# Patient Record
Sex: Male | Born: 1970
Health system: Southern US, Community
[De-identification: ages and names within clinical notes are randomized; demographics above are authoritative.]

## PROBLEM LIST (undated history)

## (undated) DIAGNOSIS — E119 Type 2 diabetes mellitus without complications: Secondary | ICD-10-CM

## (undated) HISTORY — DX: Type 2 diabetes mellitus without complications: E11.9

---

## 2013-09-17 ENCOUNTER — Other Ambulatory Visit (HOSPITAL_COMMUNITY): Payer: Self-pay | Admitting: Pulmonary Disease

## 2013-09-17 DIAGNOSIS — R945 Abnormal results of liver function studies: Principal | ICD-10-CM

## 2013-09-17 DIAGNOSIS — R7989 Other specified abnormal findings of blood chemistry: Secondary | ICD-10-CM

## 2013-09-22 ENCOUNTER — Ambulatory Visit (HOSPITAL_COMMUNITY)
Admission: RE | Admit: 2013-09-22 | Discharge: 2013-09-22 | Disposition: A | Discharge status: Home / Self Care | Payer: Federal, State, Local not specified - PPO | Source: Ambulatory Visit | Attending: Pulmonary Disease | Admitting: Pulmonary Disease

## 2013-09-22 DIAGNOSIS — R7989 Other specified abnormal findings of blood chemistry: Secondary | ICD-10-CM | POA: Insufficient documentation

## 2013-09-22 DIAGNOSIS — R945 Abnormal results of liver function studies: Secondary | ICD-10-CM

## 2013-10-16 ENCOUNTER — Encounter (INDEPENDENT_AMBULATORY_CARE_PROVIDER_SITE_OTHER): Payer: Self-pay | Admitting: *Deleted

## 2013-11-18 ENCOUNTER — Ambulatory Visit (INDEPENDENT_AMBULATORY_CARE_PROVIDER_SITE_OTHER): Payer: Federal, State, Local not specified - PPO | Admitting: Internal Medicine

## 2013-12-03 ENCOUNTER — Encounter (INDEPENDENT_AMBULATORY_CARE_PROVIDER_SITE_OTHER): Payer: Self-pay | Admitting: Internal Medicine

## 2013-12-03 ENCOUNTER — Ambulatory Visit (INDEPENDENT_AMBULATORY_CARE_PROVIDER_SITE_OTHER): Payer: Federal, State, Local not specified - PPO | Admitting: Internal Medicine

## 2013-12-03 VITALS — BP 88/50 | HR 68 | Temp 98.1°F | Ht 71.0 in | Wt 184.5 lb

## 2013-12-03 DIAGNOSIS — E119 Type 2 diabetes mellitus without complications: Secondary | ICD-10-CM | POA: Insufficient documentation

## 2013-12-03 DIAGNOSIS — R748 Abnormal levels of other serum enzymes: Secondary | ICD-10-CM | POA: Insufficient documentation

## 2013-12-03 NOTE — Patient Instructions (Signed)
OV in 3 months. 

## 2013-12-03 NOTE — Progress Notes (Signed)
   Subjective:    Patient ID: Anthony ClinchBradley Bossard, male    DOB: 07/20/1970, 43 y.o.   MRN: [id number]  HPI Referred to our office by Dr. Juanetta GoslingHawkins for elevated liver enzymes.  Prior hx of elevated liver enzymes in the past. Past hx of taking Zocor and was taking off. His appetite is good. No weight loss. No abdominal pain. Usually has a BM x 1 day.  Hx significant for Diabetes Type 1 x 10 yrs.  09/10/2013 ALP 98, AST 44, ALT 58, total bili 0.5. 09/18/2013 Hep B Surface Antigen negative, Hep C antibody negative, Hep B core Ab, IgM negative., Hepatitis A antibody, IgM negative.  Hemoglobin A1C 6.7 09/22/2013 US liver : No focal lesions identified. WNL in parenchymal echogenicity.  Review of Systems Past Medical History  Diagnosis Date  . Diabetes     Type 1 x 10 yrs    History reviewed. No pertinent past surgical history.  Allergies  Allergen Reactions  . Codeine     Nausea and vomiting    No current outpatient prescriptions on file prior to visit.   No current facility-administered medications on file prior to visit.        Objective:   Physical Exam Filed Vitals:   12/03/13 1504  BP: 88/50  Pulse: 68  Temp: 98.1 F (36.7 C)  Height: 5\' 11"  (1.803 m)  Weight: 184 lb 8 oz (83.689 kg)  Alert and oriented. Skin warm and dry. Oral mucosa is moist.   . Sclera anicteric, conjunctivae is pink. Thyroid not enlarged. No cervical lymphadenopathy. Lungs clear. Heart regular rate and rhythm.  Abdomen is soft. Bowel sounds are positive. No hepatomegaly. No abdominal masses felt. No tenderness.  No edema to lower extremities. Patient is alert and oriented.       Assessment & Plan:  Elevated liver enzymes.  Hep  A, B and C ruled out. US abdomen was normal Enzymes slightly elevated. Repeat today and OV in 3 months and will repeat

## 2013-12-04 ENCOUNTER — Telehealth (INDEPENDENT_AMBULATORY_CARE_PROVIDER_SITE_OTHER): Payer: Self-pay | Admitting: Internal Medicine

## 2013-12-04 DIAGNOSIS — R748 Abnormal levels of other serum enzymes: Secondary | ICD-10-CM

## 2013-12-04 LAB — HEPATIC FUNCTION PANEL
ALBUMIN: 4.7 g/dL (ref 3.5–5.2)
ALT: 78 U/L — AB (ref 0–53)
AST: 42 U/L — ABNORMAL HIGH (ref 0–37)
Alkaline Phosphatase: 121 U/L — ABNORMAL HIGH (ref 39–117)
BILIRUBIN INDIRECT: 0.4 mg/dL (ref 0.2–1.2)
Bilirubin, Direct: 0.1 mg/dL (ref 0.0–0.3)
TOTAL PROTEIN: 7 g/dL (ref 6.0–8.3)
Total Bilirubin: 0.5 mg/dL (ref 0.2–1.2)

## 2013-12-04 NOTE — Telephone Encounter (Signed)
Error

## 2013-12-04 NOTE — Telephone Encounter (Signed)
Message left

## 2013-12-06 LAB — CBC WITH DIFFERENTIAL/PLATELET
Basophils Absolute: 0 10*3/uL (ref 0.0–0.1)
Basophils Relative: 0 % (ref 0–1)
EOS ABS: 0.1 10*3/uL (ref 0.0–0.7)
Eosinophils Relative: 1 % (ref 0–5)
HEMATOCRIT: 43.7 % (ref 39.0–52.0)
HEMOGLOBIN: 15.7 g/dL (ref 13.0–17.0)
LYMPHS ABS: 1.3 10*3/uL (ref 0.7–4.0)
Lymphocytes Relative: 19 % (ref 12–46)
MCH: 30.1 pg (ref 26.0–34.0)
MCHC: 35.9 g/dL (ref 30.0–36.0)
MCV: 83.9 fL (ref 78.0–100.0)
MONOS PCT: 9 % (ref 3–12)
Monocytes Absolute: 0.6 10*3/uL (ref 0.1–1.0)
NEUTROS PCT: 71 % (ref 43–77)
Neutro Abs: 4.8 10*3/uL (ref 1.7–7.7)
Platelets: 303 10*3/uL (ref 150–400)
RBC: 5.21 MIL/uL (ref 4.22–5.81)
RDW: 12.7 % (ref 11.5–15.5)
WBC: 6.8 10*3/uL (ref 4.0–10.5)

## 2013-12-06 LAB — FERRITIN: Ferritin: 47 ng/mL (ref 22–322)

## 2013-12-08 LAB — MITOCHONDRIAL/SMOOTH MUSCLE AB PNL
MITOCHONDRIAL M2 AB, IGG: 0.69 (ref <0.91)
SMOOTH MUSCLE AB: 33 U — AB (ref <20)

## 2013-12-08 LAB — ANA: Anti Nuclear Antibody(ANA): NEGATIVE

## 2013-12-16 ENCOUNTER — Telehealth (INDEPENDENT_AMBULATORY_CARE_PROVIDER_SITE_OTHER): Payer: Self-pay | Admitting: Internal Medicine

## 2013-12-16 NOTE — Telephone Encounter (Signed)
Off Zocor for a couple of years. Will repeat Hepatic function in 4 weeks. If it is climbing, I will talk with Dr. Karilyn Cotaehman about a liver biopsy. Needs to exercise.

## 2013-12-17 ENCOUNTER — Telehealth (INDEPENDENT_AMBULATORY_CARE_PROVIDER_SITE_OTHER): Payer: Self-pay | Admitting: *Deleted

## 2013-12-17 DIAGNOSIS — R748 Abnormal levels of other serum enzymes: Secondary | ICD-10-CM

## 2013-12-17 NOTE — Telephone Encounter (Signed)
.  Per Delrae Renderri Setzer,NP patient will need to have lab work drawn in 4 weeks.

## 2013-12-24 ENCOUNTER — Other Ambulatory Visit (INDEPENDENT_AMBULATORY_CARE_PROVIDER_SITE_OTHER): Payer: Self-pay | Admitting: *Deleted

## 2013-12-24 ENCOUNTER — Encounter (INDEPENDENT_AMBULATORY_CARE_PROVIDER_SITE_OTHER): Payer: Self-pay | Admitting: *Deleted

## 2013-12-24 DIAGNOSIS — R748 Abnormal levels of other serum enzymes: Secondary | ICD-10-CM

## 2014-01-18 LAB — HEPATIC FUNCTION PANEL
ALK PHOS: 120 U/L — AB (ref 39–117)
ALT: 69 U/L — ABNORMAL HIGH (ref 0–53)
AST: 39 U/L — ABNORMAL HIGH (ref 0–37)
Albumin: 4.4 g/dL (ref 3.5–5.2)
BILIRUBIN DIRECT: 0.1 mg/dL (ref 0.0–0.3)
BILIRUBIN TOTAL: 0.6 mg/dL (ref 0.2–1.2)
Indirect Bilirubin: 0.5 mg/dL (ref 0.2–1.2)
Total Protein: 6.9 g/dL (ref 6.0–8.3)

## 2014-01-27 ENCOUNTER — Telehealth (INDEPENDENT_AMBULATORY_CARE_PROVIDER_SITE_OTHER): Payer: Self-pay | Admitting: *Deleted

## 2014-01-27 DIAGNOSIS — R748 Abnormal levels of other serum enzymes: Secondary | ICD-10-CM

## 2014-01-27 NOTE — Addendum Note (Signed)
Addended by: Shona NeedlesDD, Donovan Gatchel M on: 01/27/2014 03:27 PM   Modules accepted: Orders

## 2014-01-27 NOTE — Telephone Encounter (Signed)
.  Per Anthony Renderri Setzer,NP have labs done in 3 months.

## 2014-02-19 ENCOUNTER — Encounter (INDEPENDENT_AMBULATORY_CARE_PROVIDER_SITE_OTHER): Payer: Self-pay

## 2014-03-17 ENCOUNTER — Encounter (INDEPENDENT_AMBULATORY_CARE_PROVIDER_SITE_OTHER): Payer: Self-pay | Admitting: Internal Medicine

## 2014-03-17 ENCOUNTER — Ambulatory Visit (INDEPENDENT_AMBULATORY_CARE_PROVIDER_SITE_OTHER): Payer: Federal, State, Local not specified - PPO | Admitting: Internal Medicine

## 2014-03-17 VITALS — BP 100/64 | HR 66 | Temp 98.4°F | Resp 18 | Ht 71.0 in | Wt 155.1 lb

## 2014-03-17 DIAGNOSIS — R748 Abnormal levels of other serum enzymes: Secondary | ICD-10-CM

## 2014-03-17 NOTE — Patient Instructions (Signed)
Physician will call with results of blood tests when completed. Please check with Dr. Juanetta GoslingHawkins about getting hepatitis A and B vaccination

## 2014-03-17 NOTE — Progress Notes (Signed)
Presenting complaint;  Follow-up for elevated transaminases.  Database;  Patient is 44 year old Caucasian male who has history of elevated transaminases and was last seen in September 2015 by Ms. Setzer NP. Prior to his visit he had negative upper abdominal ultrasound, negative hepatitis B surface antigen, nonreactive hepatitis C antibody and negative hepatitis B core antibody IgM. Following his visit he had negative ANA and AMA but smooth muscle antibody was positive at a low titer. Serum ferritin was 47.  Subjective;  Patient states he has had mildly elevated transaminases for about 3 years. When this abnormality was discovered simvastatin was discontinued but his transaminases have never shown to normal. He states glycemic control has been satisfactory. Hemoglobin A1c usually is below 7 although one from 4 days ago was 7.4. There is no history of icteric hepatitis. He has very good appetite. He denies abdominal pain or pruritus. He does not recall that he's ever been vaccinated for hepatitis A or B. He does not drink alcohol. He is very active and exercises or walks at least 4 times a week. Family history is negative for chronic liver disease.    Current Medications: Outpatient Encounter Prescriptions as of 03/17/2014  Medication Sig  . insulin glargine (LANTUS) 100 UNIT/ML injection Inject into the skin. 34 units at night  . ramipril (ALTACE) 2.5 MG capsule Take 2.5 mg by mouth daily.  . sitaGLIPtin-metformin (JANUMET) 50-1000 MG per tablet Take 1 tablet by mouth 2 (two) times daily with a meal.    Objective: Blood pressure 100/64, pulse 66, temperature 98.4 F (36.9 C), temperature source Oral, resp. rate 18, height 5\' 11"  (1.803 m), weight 155 lb 1.6 oz (70.353 kg).  BMI is 21 Patient is alert and in no acute distress. Conjunctiva is pink. Sclera is nonicteric Oropharyngeal mucosa is normal. No neck masses or thyromegaly noted. Cardiac exam with regular rhythm normal S1 and  S2. No murmur or gallop noted. Lungs are clear to auscultation. Abdomen is flat and soft without tenderness. Liver edge is easily palpable but sharp marginated soft and nontender. Spleen is not palpable. By percussion liver is 13-14 cm.  No LE edema or clubbing noted.  Labs/studies Results: Lab studies from 03/13/2014 (Dr. Juanetta GoslingHawkins office ). Bilirubin 0.6, AP 146, AST 40, ALT 82, total protein 7.1, albumin 4.6 and globulin 2.5.  HbA1c 7.4.   Lab data from 12/04/2013 Serum ferritin 47. ANA negative. SMA positive at 33 (normal less than 20). Antimitochondrial M2 antibody negative.  Ultrasound from 09/22/2013  No evidence of cholelithiasis. CBD measured 6.3 mm. Normal hepatic structure and no evidence of hepatomegaly. Spleen size within normal limits.     Assessment:  #1. Mildly elevated transaminases of 3 years duration. Transaminases have not returned to normal on stopping simvastatin. Hepatic ultrasound does not show any changes of fatty liver. His BMI is 21 and I doubt that he has fatty liver. Smooth muscle antibody was positive at a low titer but ANA was negative. Autoimmune hepatitis remains in differential diagnosis. Drug-induced mild hepatic injury also remains in differential diagnosis.He will need liver biopsy at some point in order to determine the etiology of transaminitis.    Plan:  Patient will go the lab for the following; Sedimentation rate,  Smooth muscle antibody. Serum ceruloplasmin. Serum alpha-1 antitrypsin level. Further recommendations will be made after results of the studies available for review.

## 2014-03-18 LAB — SEDIMENTATION RATE: Sed Rate: 1 mm/hr (ref 0–16)

## 2014-03-19 LAB — ALPHA-1-ANTITRYPSIN: A-1 Antitrypsin, Ser: 152 mg/dL (ref 83–199)

## 2014-03-19 LAB — CERULOPLASMIN: CERULOPLASMIN: 20 mg/dL (ref 18–36)

## 2014-03-20 ENCOUNTER — Encounter (INDEPENDENT_AMBULATORY_CARE_PROVIDER_SITE_OTHER): Payer: Self-pay

## 2014-03-20 ENCOUNTER — Telehealth (INDEPENDENT_AMBULATORY_CARE_PROVIDER_SITE_OTHER): Payer: Self-pay | Admitting: *Deleted

## 2014-03-20 NOTE — Telephone Encounter (Signed)
Patient seen in the office this week. Dr.Rehman ask to find out when the patient last had normal LFT's. The patient states that he feels that it was about 3 years ago. Dr.Hawkins office called , a message was left on Becky's voice mail asking for this information. Advised that when it was found to fax it to my attention.

## 2014-03-25 ENCOUNTER — Other Ambulatory Visit (INDEPENDENT_AMBULATORY_CARE_PROVIDER_SITE_OTHER): Payer: Self-pay | Admitting: *Deleted

## 2014-03-25 ENCOUNTER — Telehealth (INDEPENDENT_AMBULATORY_CARE_PROVIDER_SITE_OTHER): Payer: Self-pay | Admitting: *Deleted

## 2014-03-25 ENCOUNTER — Encounter (INDEPENDENT_AMBULATORY_CARE_PROVIDER_SITE_OTHER): Payer: Self-pay | Admitting: *Deleted

## 2014-03-25 DIAGNOSIS — R748 Abnormal levels of other serum enzymes: Secondary | ICD-10-CM

## 2014-03-26 NOTE — Telephone Encounter (Signed)
Lab has been faxed to [facility] 

## 2014-03-26 NOTE — Telephone Encounter (Signed)
Per Dr.Rehman the patient will need to have labs drawn. 

## 2014-04-12 LAB — HEPATIC FUNCTION PANEL
ALK PHOS: 126 U/L — AB (ref 39–117)
ALT: 58 U/L — AB (ref 0–53)
AST: 27 U/L (ref 0–37)
Albumin: 4 g/dL (ref 3.5–5.2)
BILIRUBIN DIRECT: 0.1 mg/dL (ref 0.0–0.3)
BILIRUBIN INDIRECT: 0.5 mg/dL (ref 0.2–1.2)
Total Bilirubin: 0.6 mg/dL (ref 0.2–1.2)
Total Protein: 6.6 g/dL (ref 6.0–8.3)

## 2014-04-14 LAB — ANTI-SMOOTH MUSCLE ANTIBODY, IGG: Smooth Muscle Ab: 21 U — ABNORMAL HIGH (ref <20)

## 2014-04-15 ENCOUNTER — Telehealth (INDEPENDENT_AMBULATORY_CARE_PROVIDER_SITE_OTHER): Payer: Self-pay | Admitting: *Deleted

## 2014-04-15 DIAGNOSIS — R748 Abnormal levels of other serum enzymes: Secondary | ICD-10-CM

## 2014-04-15 NOTE — Telephone Encounter (Signed)
Per Dr.Rehman the patient will need to have labs drawn patient to have labs done in 3 months.

## 2014-07-01 ENCOUNTER — Other Ambulatory Visit (INDEPENDENT_AMBULATORY_CARE_PROVIDER_SITE_OTHER): Payer: Self-pay | Admitting: *Deleted

## 2014-07-01 ENCOUNTER — Encounter (INDEPENDENT_AMBULATORY_CARE_PROVIDER_SITE_OTHER): Payer: Self-pay | Admitting: *Deleted

## 2014-07-01 DIAGNOSIS — R748 Abnormal levels of other serum enzymes: Secondary | ICD-10-CM

## 2014-07-21 LAB — HEPATIC FUNCTION PANEL
ALT: 58 U/L — ABNORMAL HIGH (ref 0–53)
AST: 28 U/L (ref 0–37)
Albumin: 4.5 g/dL (ref 3.5–5.2)
Alkaline Phosphatase: 169 U/L — ABNORMAL HIGH (ref 39–117)
BILIRUBIN INDIRECT: 0.5 mg/dL (ref 0.2–1.2)
BILIRUBIN TOTAL: 0.6 mg/dL (ref 0.2–1.2)
Bilirubin, Direct: 0.1 mg/dL (ref 0.0–0.3)
Total Protein: 7.2 g/dL (ref 6.0–8.3)

## 2014-07-24 LAB — ANTI-SMOOTH MUSCLE ANTIBODY, IGG: Smooth Muscle Ab: 38 U — ABNORMAL HIGH (ref <20)

## 2014-09-15 ENCOUNTER — Ambulatory Visit (INDEPENDENT_AMBULATORY_CARE_PROVIDER_SITE_OTHER): Payer: Federal, State, Local not specified - PPO | Admitting: Internal Medicine

## 2014-09-28 ENCOUNTER — Ambulatory Visit (INDEPENDENT_AMBULATORY_CARE_PROVIDER_SITE_OTHER): Payer: Federal, State, Local not specified - PPO | Admitting: Internal Medicine

## 2014-09-28 ENCOUNTER — Encounter (INDEPENDENT_AMBULATORY_CARE_PROVIDER_SITE_OTHER): Payer: Self-pay | Admitting: Internal Medicine

## 2014-09-28 VITALS — BP 100/64 | HR 66 | Temp 98.3°F | Resp 18 | Ht 71.0 in | Wt 158.5 lb

## 2014-09-28 DIAGNOSIS — R748 Abnormal levels of other serum enzymes: Secondary | ICD-10-CM

## 2014-09-28 NOTE — Progress Notes (Signed)
Presenting complaint;  Follow-up for elevated transaminases.  Subjective:  Patient is 44 year old Caucasian male who is here for scheduled visit. He was last seen on 03/17/2014. As before he has no complaints. He denies abdominal pain pruritus fatigue or weakness. Regarding his diabetes mellitus he is now under care of Dr. Leslie Dales. Patient states he had 7 day glucose monitoring and has office visit with him next week.Dr. Leslie Dales feels he may have type I autoimmune diabetes mellitus. Patient has copy of blood work from 08/06/2014. His hemoglobin A1c was 6.5. Previously was 6.9. Family history is negative for chronic liver disease or celiac disease.   Current Medications: Outpatient Encounter Prescriptions as of 09/28/2014  Medication Sig  . insulin glargine (LANTUS) 100 UNIT/ML injection Inject into the skin. 34 units at night  . ramipril (ALTACE) 2.5 MG capsule Take 2.5 mg by mouth daily.  . sitaGLIPtin-metformin (JANUMET) 50-1000 MG per tablet Take 1 tablet by mouth 2 (two) times daily with a meal.   No facility-administered encounter medications on file as of 09/28/2014.     Objective: Blood pressure 100/64, pulse 66, temperature 98.3 F (36.8 C), temperature source Oral, resp. rate 18, height  (1.803 m), weight 158 lb 8 oz (71.895 kg). Patient is alert and in no acute distress. Conjunctiva is pink. Sclera is nonicteric Oropharyngeal mucosa is normal. No neck masses or thyromegaly noted. Cardiac exam with regular rhythm normal S1 and S2. No murmur or gallop noted. Lungs are clear to auscultation. Abdomen is symmetrical soft and nontender without organomegaly or masses. Liver edge is indistinct below RCM and is soft. Liver span percussed to be about 15 cm. No LE edema or clubbing noted.  Labs/studies Results: Lab data from 08/06/2014  WBC 6.3, H&H 15.2 and 42.8 and platelet count 276K  Glucose 155, sodium 137, potassium 4.6, chloride 98, CO2 23, BUN 15 and creatinine  1.03  Bilirubin 0.4, AP 145, AST 36, ALT 58, total protein 6.5, albumin 4.3  Hemoglobin A1c was 6.5  Serum B12 level 814. TSH 2.560    Assessment:  #1. Mildly elevated transaminases of 3 years duration. This abnormality was felt to be secondary to simvastatin but transaminases did not normalize on stopping this medication. Biochemical markers have been negative for hepatitis B and C as well as for hemachromatosis, alpha-1 antitrypsin deficiency, Wilson's disease and ultrasound negative for fatty liver. Similarly AMA was negative but smooth muscle antibody has been positive on 3 different occasions suggesting smoldering or low-grade autoimmune hepatitis. This diagnosis is more likely given that  Dr. Leslie Dales feels he may have type I autoimmune diabetes mellitus. He does not have stigmata of portal hypertension but I believe it would be reasonable to proceed with liver biopsy in order to make accurate diagnosis so that he can be effectively treated if at all possible..   Plan:  Ultrasound-guided liver biopsy to be scheduled sometime within the next few weeks. Further recommendations to follow depending on biopsy results.

## 2014-09-28 NOTE — Patient Instructions (Signed)
Ultrasound-guided liver biopsy to be scheduled. 

## 2014-09-29 ENCOUNTER — Other Ambulatory Visit (INDEPENDENT_AMBULATORY_CARE_PROVIDER_SITE_OTHER): Payer: Self-pay | Admitting: Internal Medicine

## 2014-09-29 DIAGNOSIS — R740 Nonspecific elevation of levels of transaminase and lactic acid dehydrogenase [LDH]: Principal | ICD-10-CM

## 2014-09-29 DIAGNOSIS — R7401 Elevation of levels of liver transaminase levels: Secondary | ICD-10-CM

## 2014-10-07 ENCOUNTER — Encounter (INDEPENDENT_AMBULATORY_CARE_PROVIDER_SITE_OTHER): Payer: Self-pay

## 2014-10-08 ENCOUNTER — Ambulatory Visit (HOSPITAL_COMMUNITY): Payer: Federal, State, Local not specified - PPO

## 2014-10-15 ENCOUNTER — Other Ambulatory Visit: Payer: Self-pay | Admitting: Radiology

## 2014-10-16 ENCOUNTER — Other Ambulatory Visit: Payer: Self-pay | Admitting: Radiology

## 2014-10-19 ENCOUNTER — Ambulatory Visit (HOSPITAL_COMMUNITY)
Admission: RE | Admit: 2014-10-19 | Discharge: 2014-10-19 | Disposition: A | Discharge status: Home / Self Care | Payer: Federal, State, Local not specified - PPO | Source: Ambulatory Visit | Attending: Internal Medicine | Admitting: Internal Medicine

## 2014-10-19 ENCOUNTER — Encounter (HOSPITAL_COMMUNITY): Payer: Self-pay

## 2014-10-19 DIAGNOSIS — E109 Type 1 diabetes mellitus without complications: Secondary | ICD-10-CM | POA: Insufficient documentation

## 2014-10-19 DIAGNOSIS — Z794 Long term (current) use of insulin: Secondary | ICD-10-CM | POA: Diagnosis not present

## 2014-10-19 DIAGNOSIS — Z79899 Other long term (current) drug therapy: Secondary | ICD-10-CM | POA: Insufficient documentation

## 2014-10-19 DIAGNOSIS — R740 Nonspecific elevation of levels of transaminase and lactic acid dehydrogenase [LDH]: Secondary | ICD-10-CM | POA: Insufficient documentation

## 2014-10-19 DIAGNOSIS — R7401 Elevation of levels of liver transaminase levels: Secondary | ICD-10-CM | POA: Insufficient documentation

## 2014-10-19 LAB — CBC
HCT: 43.6 % (ref 39.0–52.0)
Hemoglobin: 15.4 g/dL (ref 13.0–17.0)
MCH: 30.6 pg (ref 26.0–34.0)
MCHC: 35.3 g/dL (ref 30.0–36.0)
MCV: 86.5 fL (ref 78.0–100.0)
PLATELETS: 207 10*3/uL (ref 150–400)
RBC: 5.04 MIL/uL (ref 4.22–5.81)
RDW: 12.2 % (ref 11.5–15.5)
WBC: 4.3 10*3/uL (ref 4.0–10.5)

## 2014-10-19 LAB — PROTIME-INR
INR: 1 (ref 0.00–1.49)
PROTHROMBIN TIME: 13.4 s (ref 11.6–15.2)

## 2014-10-19 LAB — APTT: aPTT: 32 seconds (ref 24–37)

## 2014-10-19 LAB — GLUCOSE, CAPILLARY: Glucose-Capillary: 206 mg/dL — ABNORMAL HIGH (ref 65–99)

## 2014-10-19 MED ORDER — FENTANYL CITRATE (PF) 100 MCG/2ML IJ SOLN
INTRAMUSCULAR | Status: AC | PRN
  Administered 2014-10-19: 50 ug via INTRAVENOUS
  Administered 2014-10-19: 25 ug via INTRAVENOUS

## 2014-10-19 MED ORDER — SODIUM CHLORIDE 0.9 % IV SOLN
INTRAVENOUS | Status: DC
  Administered 2014-10-19: 09:00:00 via INTRAVENOUS

## 2014-10-19 MED ORDER — MIDAZOLAM HCL 2 MG/2ML IJ SOLN
INTRAMUSCULAR | Status: AC | PRN
  Administered 2014-10-19: 1 mg via INTRAVENOUS
  Administered 2014-10-19: 0.5 mg via INTRAVENOUS

## 2014-10-19 MED ORDER — LIDOCAINE HCL (PF) 1 % IJ SOLN
INTRAMUSCULAR | Status: AC
  Filled 2014-10-19: qty 10

## 2014-10-19 MED ORDER — MIDAZOLAM HCL 2 MG/2ML IJ SOLN
INTRAMUSCULAR | Status: AC
  Filled 2014-10-19: qty 2

## 2014-10-19 MED ORDER — GELATIN ABSORBABLE 12-7 MM EX MISC
CUTANEOUS | Status: AC
  Filled 2014-10-19: qty 1

## 2014-10-19 MED ORDER — FENTANYL CITRATE (PF) 100 MCG/2ML IJ SOLN
INTRAMUSCULAR | Status: AC
  Filled 2014-10-19: qty 2

## 2014-10-19 NOTE — Procedures (Signed)
Interventional Radiology Procedure Note  Procedure: US guided liver biopsy, medical-liver bx.  3x 18G core.  Complications: None Recommendations:  - Ok to shower tomorrow - Do not submerge for 7 days - Routine care.  - observe 2.5 hours  Signed,  Yvone Neu. Loreta Ave, DO

## 2014-10-19 NOTE — H&P (Signed)
Chief Complaint: Patient was seen in consultation today for liver biopsy at the request of Rehman,Najeeb U  Referring Physician(s): Rehman,Najeeb U  History of Present Illness: Anthony Ramsey is a 44 y.o. male undergoing workup for elevated liver enzymes. He is referred to IR for random liver biopsy. PMHx, meds, labs, prior imaging reviewed. Pt feels well today. Has been NPO this am  Past Medical History  Diagnosis Date  . Diabetes     Type 1 x 10 yrs    History reviewed. No pertinent past surgical history.  Allergies: Codeine  Medications: Prior to Admission medications   Medication Sig Start Date End Date Taking? Authorizing Provider  insulin glargine (LANTUS) 100 UNIT/ML injection Inject 28 Units into the skin at bedtime.    Yes Historical Provider, MD  ramipril (ALTACE) 2.5 MG capsule Take 2.5 mg by mouth daily.   Yes Historical Provider, MD  sitaGLIPtin-metformin (JANUMET) 50-1000 MG per tablet Take 1 tablet by mouth daily.    Yes Historical Provider, MD     History reviewed. No pertinent family history.  Social History   Social History  . Marital Status: Married    Spouse Name: N/A  . Number of Children: N/A  . Years of Education: N/A   Social History Main Topics  . Smoking status: Never Smoker   . Smokeless tobacco: Never Used  . Alcohol Use: No  . Drug Use: No  . Sexual Activity: Not Asked   Other Topics Concern  . None   Social History Narrative      Review of Systems: A 12 point ROS discussed and pertinent positives are indicated in the HPI above.  All other systems are negative.  Review of Systems  Vital Signs: BP 100/67 mmHg  Pulse 62  Temp(Src) 98.4 F (36.9 C)  Resp 20  Ht 6' (1.829 m)  Wt 160 lb (72.576 kg)  BMI 21.70 kg/m2  Physical Exam  Constitutional: He is oriented to person, place, and time. He appears well-developed and well-nourished. No distress.  HENT:  Head: Normocephalic.  Mouth/Throat: Oropharynx is clear and  moist.  Neck: Normal range of motion. No JVD present. No tracheal deviation present.  Cardiovascular: Normal rate, regular rhythm and normal heart sounds.   Pulmonary/Chest: Effort normal and breath sounds normal. No respiratory distress.  Abdominal: Soft. Bowel sounds are normal. He exhibits no mass. There is no tenderness.  Neurological: He is alert and oriented to person, place, and time.  Psychiatric: He has a normal mood and affect. Judgment normal.    Mallampati Score:  MD Evaluation Airway: WNL Heart: WNL Abdomen: WNL Chest/ Lungs: WNL ASA  Classification: 2 Mallampati/Airway Score: One  Imaging: No results found.  Labs:  CBC:  Recent Labs  12/04/13 1017 10/19/14 0856  WBC 6.8 4.3  HGB 15.7 15.4  HCT 43.7 43.6  PLT 303 207    COAGS:  Recent Labs  10/19/14 0856  INR 1.00  APTT 32    LIVER FUNCTION TESTS:  Recent Labs  12/03/13 1536 01/17/14 1123 04/07/14 1135 07/20/14 1115  BILITOT 0.5 0.6 0.6 0.6  AST 42* 39* 27 28  ALT 78* 69* 58* 58*  ALKPHOS 121* 120* 126* 169*  PROT 7.0 6.9 6.6 7.2  ALBUMIN 4.7 4.4 4.0 4.5    Assessment and Plan: Elevated liver enzymes For US liver biopsy Labs reviewed, ok Risks and Benefits discussed with the patient including, but not limited to bleeding, infection, damage to adjacent structures or low yield requiring additional  tests. All of the patient's questions were answered, patient is agreeable to proceed. Consent signed and in chart.   SignedBrayton El 10/19/2014, 9:40 AM   I spent a total of 15 minutes in face to face in clinical consultation, greater than 50% of which was counseling/coordinating care for random liver core biopsy.

## 2014-10-19 NOTE — Discharge Instructions (Signed)
Liver Biopsy, Care After °Refer to this sheet in the next few weeks. These instructions provide you with information on caring for yourself after your procedure. Your health care provider may also give you more specific instructions. Your treatment has been planned according to current medical practices, but problems sometimes occur. Call your health care provider if you have any problems or questions after your procedure. °WHAT TO EXPECT AFTER THE PROCEDURE °After your procedure, it is typical to have the following: °· A small amount of discomfort in the area where the biopsy was done and in the right shoulder or shoulder blade. °· A small amount of bruising around the area where the biopsy was done and on the skin over the liver. °· Sleepiness and fatigue for the rest of the day. °HOME CARE INSTRUCTIONS  °· Rest at home for 1-2 days or as directed by your health care provider. °· Have a friend or family member stay with you for at least 24 hours. °· Because of the medicines used during the procedure, you should not do the following things in the first 24 hours: °¨ Drive. °¨ Use machinery. °¨ Be responsible for the care of other people. °¨ Sign legal documents. °¨ Take a bath or shower. °· There are many different ways to close and cover an incision, including stitches, skin glue, and adhesive strips. Follow your health care provider's instructions on: °¨ Incision care. °¨ Bandage (dressing) changes and removal. °¨ Incision closure removal. °· Do not drink alcohol in the first week. °· Do not lift more than 5 pounds or play contact sports for 2 weeks after this test. °· Take medicines only as directed by your health care provider. Do not take medicine containing aspirin or non-steroidal anti-inflammatory medicines such as ibuprofen for 1 week after this test. °· It is your responsibility to get your test results. °SEEK MEDICAL CARE IF:  °· You have increased bleeding from an incision that results in more than a  small spot of blood. °· You have redness, swelling, or increasing pain in any incisions. °· You notice a discharge or a bad smell coming from any of your incisions. °· You have a fever or chills. °SEEK IMMEDIATE MEDICAL CARE IF:  °· You develop swelling, bloating, or pain in your abdomen. °· You become dizzy or faint. °· You develop a rash. °· You are nauseous or vomit. °· You have difficulty breathing, feel short of breath, or feel faint. °· You develop chest pain. °· You have problems with your speech or vision. °· You have trouble balancing or moving your arms or legs. °Document Released: 09/09/2004 Document Revised: 07/07/2013 Document Reviewed: 04/18/2013 °[employer]® Patient Information ©2015 [employer]. This information is not intended to replace advice given to you by your health care provider. Make sure you discuss any questions you have with your health care provider. ° °

## 2014-11-10 ENCOUNTER — Telehealth (INDEPENDENT_AMBULATORY_CARE_PROVIDER_SITE_OTHER): Payer: Self-pay | Admitting: *Deleted

## 2014-11-10 DIAGNOSIS — R748 Abnormal levels of other serum enzymes: Secondary | ICD-10-CM

## 2014-11-10 NOTE — Telephone Encounter (Signed)
Per Dr.Rehman the patient will need to have labs drawn in 3 months 

## 2015-01-27 ENCOUNTER — Encounter (INDEPENDENT_AMBULATORY_CARE_PROVIDER_SITE_OTHER): Payer: Self-pay | Admitting: *Deleted

## 2015-01-27 ENCOUNTER — Other Ambulatory Visit (INDEPENDENT_AMBULATORY_CARE_PROVIDER_SITE_OTHER): Payer: Self-pay | Admitting: *Deleted

## 2015-01-27 DIAGNOSIS — R748 Abnormal levels of other serum enzymes: Secondary | ICD-10-CM

## 2015-02-13 LAB — HEPATIC FUNCTION PANEL
ALBUMIN: 3.9 g/dL (ref 3.6–5.1)
ALT: 74 U/L — AB (ref 9–46)
AST: 32 U/L (ref 10–40)
Alkaline Phosphatase: 130 U/L — ABNORMAL HIGH (ref 40–115)
BILIRUBIN INDIRECT: 0.7 mg/dL (ref 0.2–1.2)
Bilirubin, Direct: 0.2 mg/dL (ref <=0.2)
TOTAL PROTEIN: 6 g/dL — AB (ref 6.1–8.1)
Total Bilirubin: 0.9 mg/dL (ref 0.2–1.2)

## 2015-02-22 ENCOUNTER — Telehealth (INDEPENDENT_AMBULATORY_CARE_PROVIDER_SITE_OTHER): Payer: Self-pay | Admitting: *Deleted

## 2015-02-22 DIAGNOSIS — R748 Abnormal levels of other serum enzymes: Secondary | ICD-10-CM

## 2015-02-22 NOTE — Telephone Encounter (Signed)
Per Dr.Rehman the patient will need to have labs drawn in 8 weeks. 

## 2015-03-29 ENCOUNTER — Encounter (INDEPENDENT_AMBULATORY_CARE_PROVIDER_SITE_OTHER): Payer: Self-pay | Admitting: *Deleted

## 2015-03-29 ENCOUNTER — Other Ambulatory Visit (INDEPENDENT_AMBULATORY_CARE_PROVIDER_SITE_OTHER): Payer: Self-pay | Admitting: *Deleted

## 2015-03-29 DIAGNOSIS — R748 Abnormal levels of other serum enzymes: Secondary | ICD-10-CM

## 2015-05-01 LAB — HEPATIC FUNCTION PANEL
ALK PHOS: 158 U/L — AB (ref 40–115)
ALT: 89 U/L — AB (ref 9–46)
AST: 42 U/L — AB (ref 10–40)
Albumin: 4.3 g/dL (ref 3.6–5.1)
BILIRUBIN DIRECT: 0.2 mg/dL (ref <=0.2)
BILIRUBIN INDIRECT: 0.5 mg/dL (ref 0.2–1.2)
TOTAL PROTEIN: 6.5 g/dL (ref 6.1–8.1)
Total Bilirubin: 0.7 mg/dL (ref 0.2–1.2)

## 2015-05-10 ENCOUNTER — Telehealth (INDEPENDENT_AMBULATORY_CARE_PROVIDER_SITE_OTHER): Payer: Self-pay | Admitting: *Deleted

## 2015-05-10 DIAGNOSIS — R740 Nonspecific elevation of levels of transaminase and lactic acid dehydrogenase [LDH]: Principal | ICD-10-CM

## 2015-05-10 DIAGNOSIS — R7401 Elevation of levels of liver transaminase levels: Secondary | ICD-10-CM

## 2015-05-10 NOTE — Telephone Encounter (Signed)
Per Dr.Rehman the patient will need to have labs drawn in 3 months followed by OV.

## 2015-05-11 ENCOUNTER — Encounter (INDEPENDENT_AMBULATORY_CARE_PROVIDER_SITE_OTHER): Payer: Self-pay | Admitting: Internal Medicine

## 2015-06-09 DIAGNOSIS — E1065 Type 1 diabetes mellitus with hyperglycemia: Secondary | ICD-10-CM | POA: Diagnosis not present

## 2015-06-16 IMAGING — US US ABDOMEN COMPLETE
1 series · 14 of 25 positions shown · non-contrast
Comparison: None.

CLINICAL DATA: Elevated liver function tests

EXAM:
ULTRASOUND ABDOMEN COMPLETE

[Series 1: us abdomen complete · 0.15mm/px · 14 of 98 slices shown]
[im 1/98]
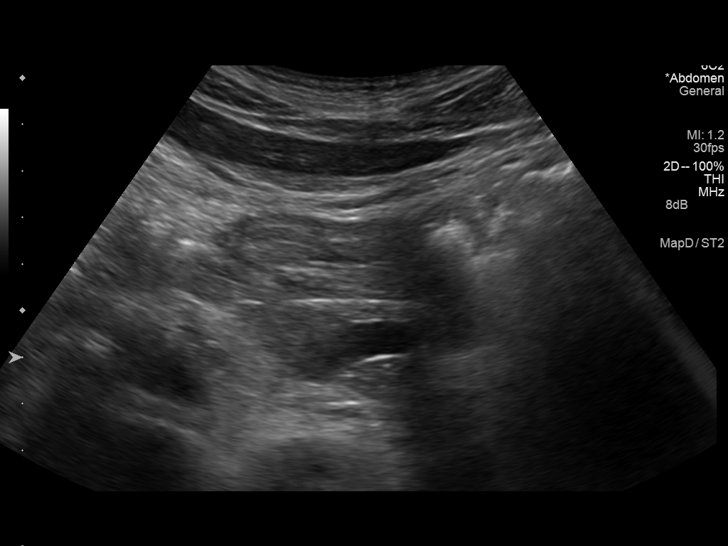
[im 9/98]
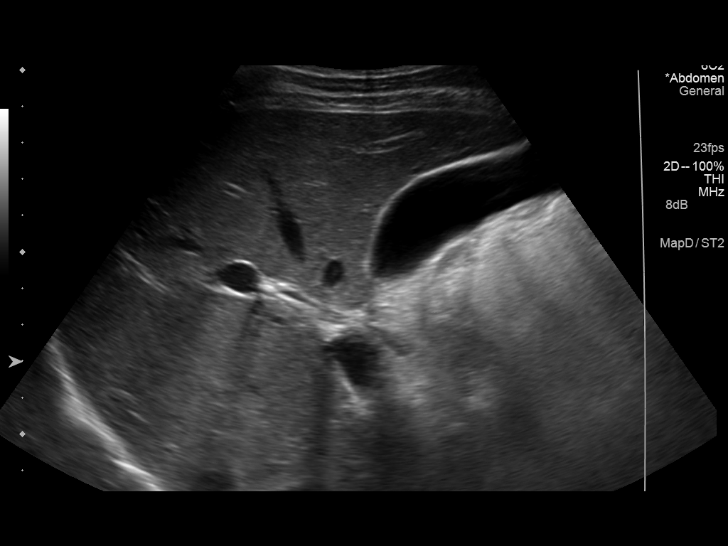
[im 17/98]
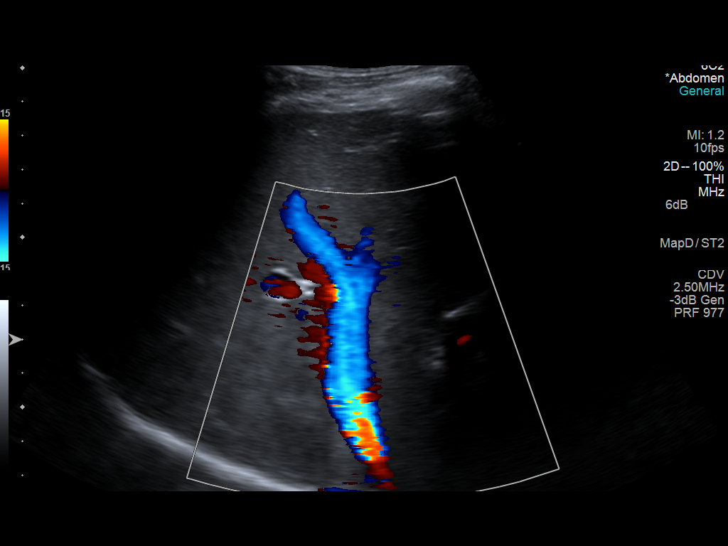
[im 25/98]
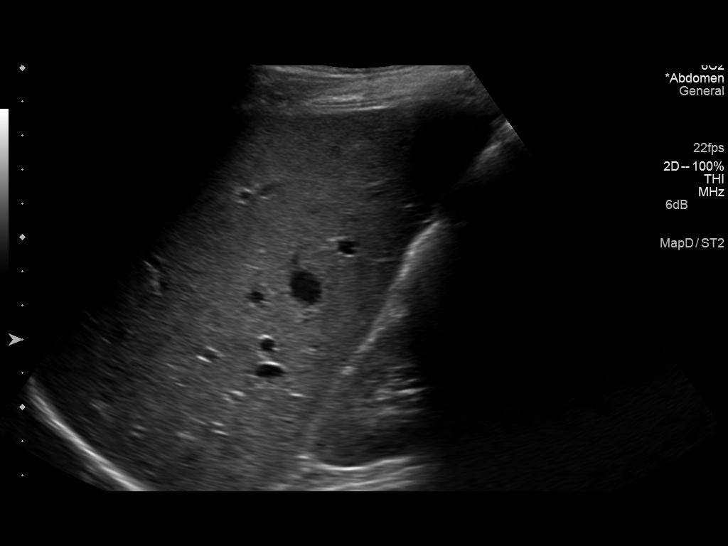
[im 33/98]
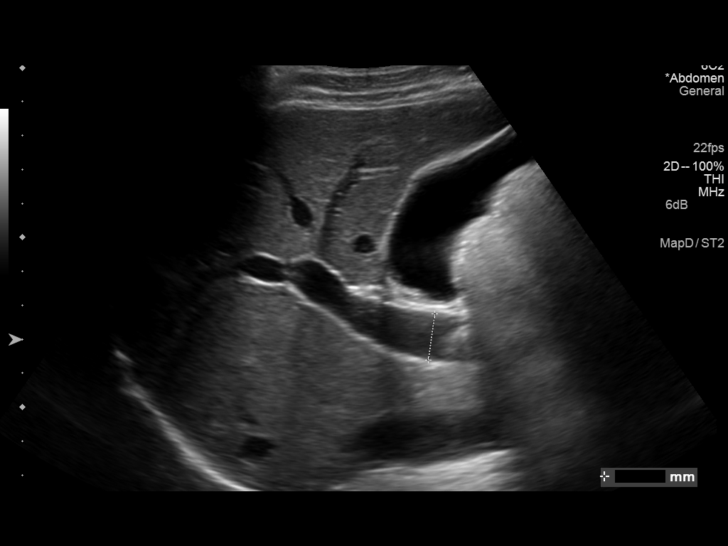
[im 37/98]
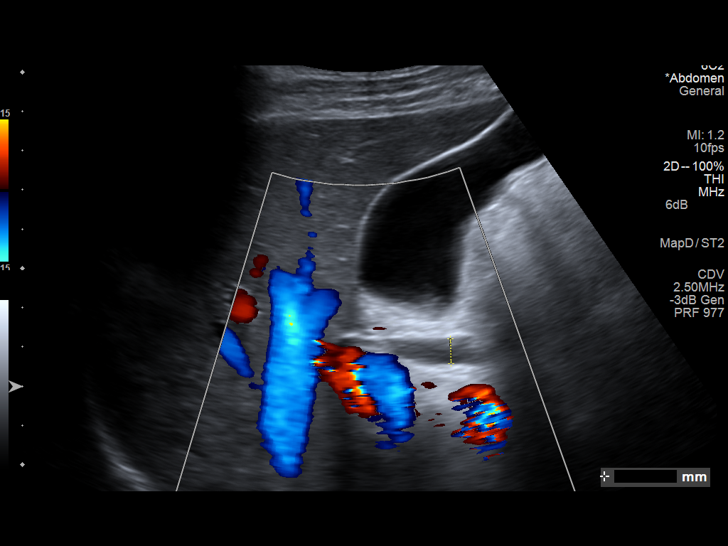
[im 45/98]
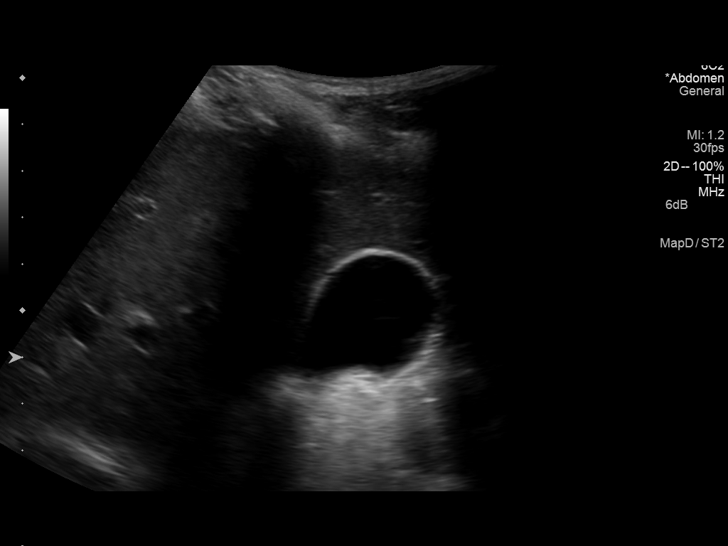
[im 53/98]
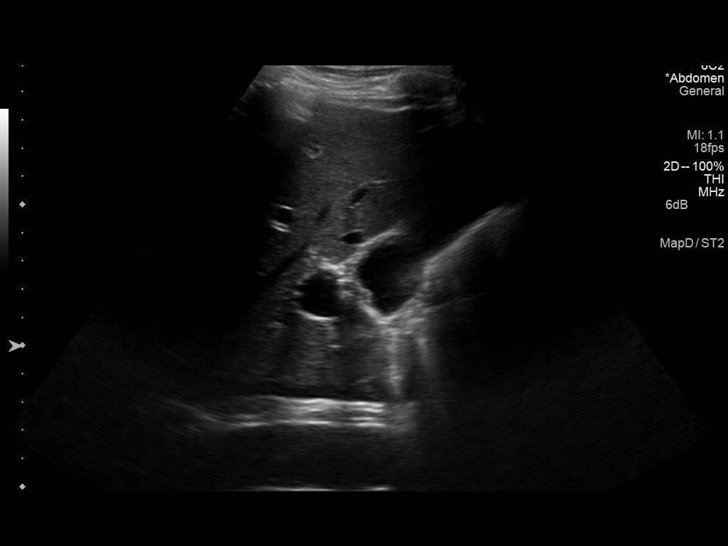
[im 61/98]
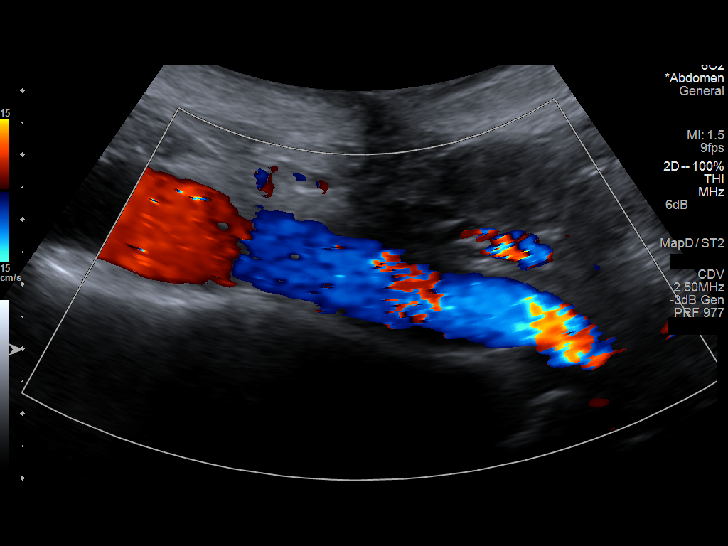
[im 65/98]
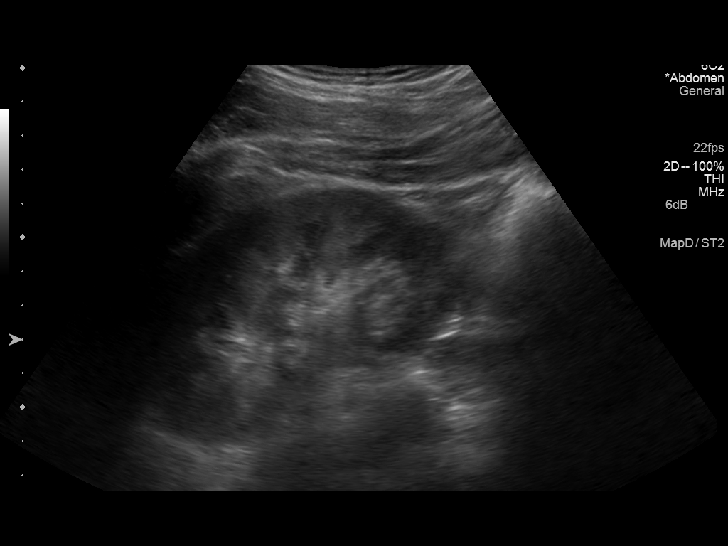
[im 73/98]
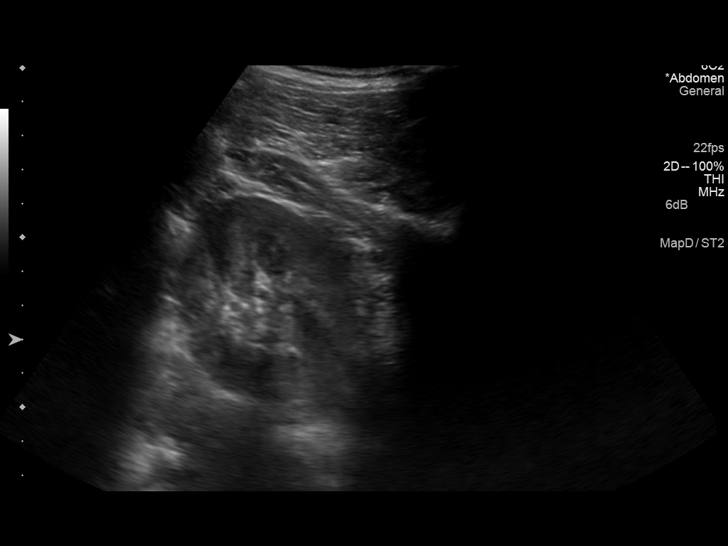
[im 81/98]
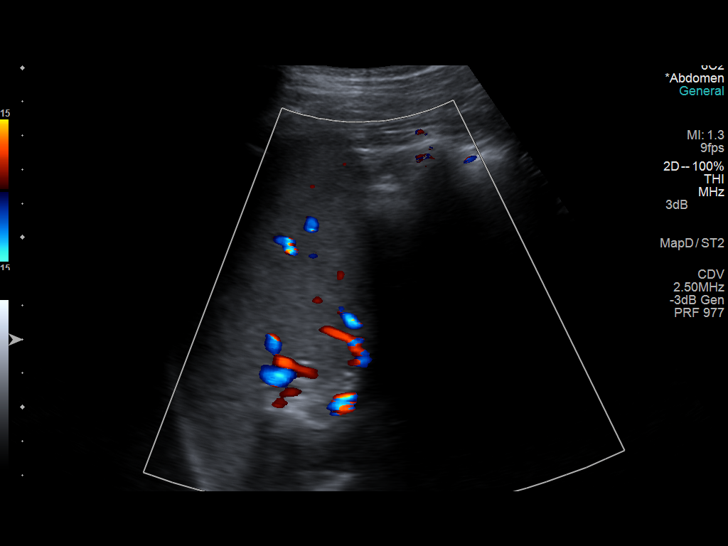
[im 89/98]
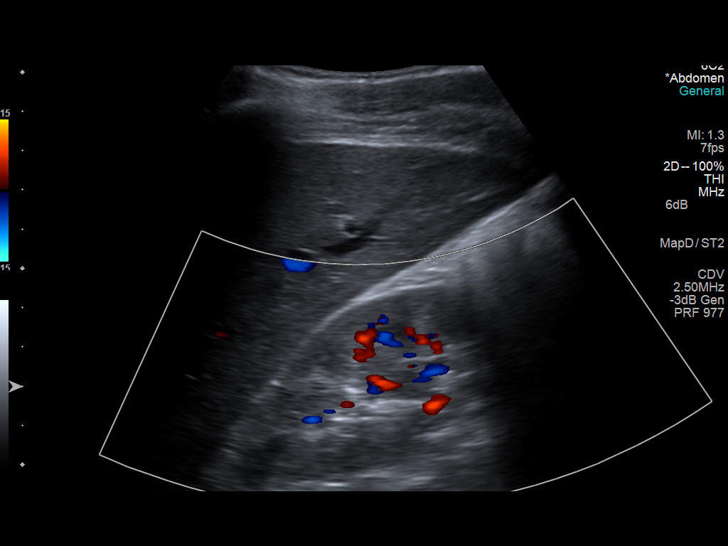
[im 98/98]
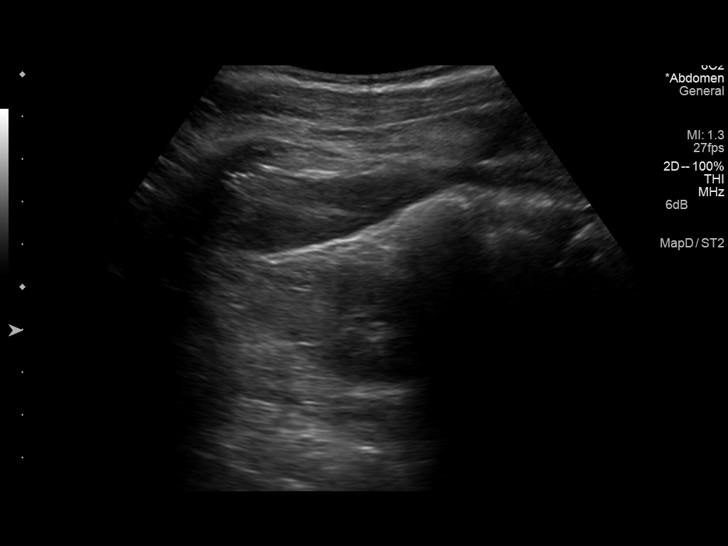

[14 of 25 positions shown; findings below may reference images not displayed]

FINDINGS: Gallbladder:

No gallstones or wall thickening visualized. No sonographic Murphy
sign noted.

Common bile duct:

Diameter: 6 mm. Where visualized, no filling defects identified
within the common bile duct. Portions of the common bile duct are
obscured by bowel gas, particularly distally.

Liver:

No focal lesion identified. Within normal limits in parenchymal
echogenicity.

IVC:

No abnormality visualized.

Pancreas:

Visualized portion unremarkable.

Spleen:

Size and appearance within normal limits.

Right Kidney:

Length: 9.1 cm. Echogenicity within normal limits. No mass or
hydronephrosis visualized.

Left Kidney:

Length: 10.4 cm. Echogenicity within normal limits. No mass or
hydronephrosis visualized.

Abdominal aorta:

No aneurysm visualized.

Other findings:

None.
IMPRESSION: Common bile duct upper normal to mildly dilated at 6 mm. Study
otherwise normal.

## 2015-06-21 DIAGNOSIS — K080 Exfoliation of teeth due to systemic causes: Secondary | ICD-10-CM | POA: Diagnosis not present

## 2015-06-23 DIAGNOSIS — E1065 Type 1 diabetes mellitus with hyperglycemia: Secondary | ICD-10-CM | POA: Diagnosis not present

## 2015-07-12 DIAGNOSIS — E7800 Pure hypercholesterolemia, unspecified: Secondary | ICD-10-CM | POA: Diagnosis not present

## 2015-07-12 DIAGNOSIS — E10649 Type 1 diabetes mellitus with hypoglycemia without coma: Secondary | ICD-10-CM | POA: Diagnosis not present

## 2015-07-13 DIAGNOSIS — E10649 Type 1 diabetes mellitus with hypoglycemia without coma: Secondary | ICD-10-CM | POA: Diagnosis not present

## 2015-07-16 DIAGNOSIS — E10649 Type 1 diabetes mellitus with hypoglycemia without coma: Secondary | ICD-10-CM | POA: Diagnosis not present

## 2015-07-16 DIAGNOSIS — Z9641 Presence of insulin pump (external) (internal): Secondary | ICD-10-CM | POA: Diagnosis not present

## 2015-07-16 DIAGNOSIS — R740 Nonspecific elevation of levels of transaminase and lactic acid dehydrogenase [LDH]: Secondary | ICD-10-CM | POA: Diagnosis not present

## 2015-07-16 DIAGNOSIS — E7800 Pure hypercholesterolemia, unspecified: Secondary | ICD-10-CM | POA: Diagnosis not present

## 2015-07-22 ENCOUNTER — Other Ambulatory Visit (INDEPENDENT_AMBULATORY_CARE_PROVIDER_SITE_OTHER): Payer: Self-pay | Admitting: *Deleted

## 2015-07-22 ENCOUNTER — Encounter (INDEPENDENT_AMBULATORY_CARE_PROVIDER_SITE_OTHER): Payer: Self-pay | Admitting: *Deleted

## 2015-07-22 DIAGNOSIS — R7401 Elevation of levels of liver transaminase levels: Secondary | ICD-10-CM

## 2015-07-22 DIAGNOSIS — R740 Nonspecific elevation of levels of transaminase and lactic acid dehydrogenase [LDH]: Principal | ICD-10-CM

## 2015-08-14 DIAGNOSIS — R740 Nonspecific elevation of levels of transaminase and lactic acid dehydrogenase [LDH]: Secondary | ICD-10-CM | POA: Diagnosis not present

## 2015-08-14 LAB — HEPATIC FUNCTION PANEL
ALK PHOS: 148 U/L — AB (ref 40–115)
ALT: 63 U/L — AB (ref 9–46)
AST: 38 U/L (ref 10–40)
Albumin: 4.3 g/dL (ref 3.6–5.1)
BILIRUBIN INDIRECT: 0.6 mg/dL (ref 0.2–1.2)
Bilirubin, Direct: 0.2 mg/dL (ref <=0.2)
TOTAL PROTEIN: 6.5 g/dL (ref 6.1–8.1)
Total Bilirubin: 0.8 mg/dL (ref 0.2–1.2)

## 2015-08-24 ENCOUNTER — Encounter (INDEPENDENT_AMBULATORY_CARE_PROVIDER_SITE_OTHER): Payer: Self-pay | Admitting: Internal Medicine

## 2015-08-24 ENCOUNTER — Ambulatory Visit (INDEPENDENT_AMBULATORY_CARE_PROVIDER_SITE_OTHER): Payer: Federal, State, Local not specified - PPO | Admitting: Internal Medicine

## 2015-08-24 VITALS — BP 100/64 | HR 66 | Temp 97.9°F | Resp 18 | Ht 71.0 in | Wt 158.4 lb

## 2015-08-24 DIAGNOSIS — R7401 Elevation of levels of liver transaminase levels: Secondary | ICD-10-CM

## 2015-08-24 DIAGNOSIS — E119 Type 2 diabetes mellitus without complications: Secondary | ICD-10-CM | POA: Diagnosis not present

## 2015-08-24 DIAGNOSIS — I10 Essential (primary) hypertension: Secondary | ICD-10-CM | POA: Diagnosis not present

## 2015-08-24 DIAGNOSIS — K769 Liver disease, unspecified: Secondary | ICD-10-CM | POA: Diagnosis not present

## 2015-08-24 DIAGNOSIS — R740 Nonspecific elevation of levels of transaminase and lactic acid dehydrogenase [LDH]: Secondary | ICD-10-CM | POA: Diagnosis not present

## 2015-08-24 DIAGNOSIS — D179 Benign lipomatous neoplasm, unspecified: Secondary | ICD-10-CM | POA: Diagnosis not present

## 2015-08-24 NOTE — Progress Notes (Signed)
Presenting complaint;  Follow-up for mildly elevated transaminases.  Database:  Patient is 45 year old Caucasian male who has about 2 year history of mildly elevated transaminases which did not improve on stopping statin. Further workup included unremarkable ultrasound negative hepatitis B surface antigen, nonreactive hepatitis C virus antibody and negative hep B core antibody IgM. Similarly ANA was negative and as an was positive at a low titer. Serum ferritin was 47. With negative workup I felt that mildly elevated transaminases a secondary to fatty liver given that he's diabetic but this was not proven and liver biopsy which was performed on 10/19/2014. Liver biopsy was unremarkable except rare eosinophils and portal areas with mild inflammation suggesting drug-induced injury. He is an inhibitor was stopped without normalization of transaminases.   Subjective:  Anthony Ramsey is here for scheduled visit. He has no complaints. He feels fine. He is on no medication other than insulin administered via insulin pump. He states his A1c ranges between 6.6 and 6.7 and his endocrinologist is quite pleased. He has good appetite. He denies abdominal pain nausea vomiting or pruritus. He has not lost or gained any weight since his last visit.   Current Medications: Outpatient Encounter Prescriptions as of 08/24/2015  Medication Sig  . Insulin Human (INSULIN PUMP) SOLN Inject into the skin. Fast Acting insulin. Novalog.  . [DISCONTINUED] insulin glargine (LANTUS) 100 UNIT/ML injection Inject 28 Units into the skin at bedtime. Reported on 08/24/2015  . [DISCONTINUED] ramipril (ALTACE) 2.5 MG capsule Take 2.5 mg by mouth daily. Reported on 08/24/2015  . [DISCONTINUED] sitaGLIPtin-metformin (JANUMET) 50-1000 MG per tablet Take 1 tablet by mouth daily. Reported on 08/24/2015   No facility-administered encounter medications on file as of 08/24/2015.     Objective: Blood pressure 100/64, pulse 66, temperature 97.9 F  (36.6 C), temperature source Oral, resp. rate 18, height 5\' 11"  (1.803 m), weight 158 lb 6.4 oz (71.85 kg). Carollee HerterShannon is alert and in no acute distress. Conjunctiva is pink. Sclera is nonicteric Oropharyngeal mucosa is normal. No neck masses or thyromegaly noted. Cardiac exam with regular rhythm normal S1 and S2. No murmur or gallop noted. Lungs are clear to auscultation. Abdomen is flat and soft without tenderness organomegaly or masses. No LE edema or clubbing noted.  Labs/studies Results: LFTs from 08/14/2015  Bilirubin 0.8, AP 148, AST 38, ALT 63 and albumin 4.3.    Assessment:  #1. Mildly elevated transaminases of 2 years duration of unknown etiology. Liver biopsy was unremarkable other than few eosinophils and portal areas. Has been no improvement in level of transaminases on stopping ACE inhibitor. Liver biopsy did not show any fibrosis. He does not have stigmata of chronic liver disease. At this point the best course would be to monitor him. Consider ultrasound and elastography in 4 years.   Plan:  Patient will talk with Dr. Juanetta GoslingHawkins about getting back on statin and ACE inhibitor as if indicated. Repeat LFTs in 6 months. SMA in 6 months. Office visit in one year.

## 2015-08-24 NOTE — Patient Instructions (Signed)
Next blood work to be done in December 2017.

## 2015-10-18 DIAGNOSIS — E10649 Type 1 diabetes mellitus with hypoglycemia without coma: Secondary | ICD-10-CM | POA: Diagnosis not present

## 2015-10-22 DIAGNOSIS — R740 Nonspecific elevation of levels of transaminase and lactic acid dehydrogenase [LDH]: Secondary | ICD-10-CM | POA: Diagnosis not present

## 2015-10-22 DIAGNOSIS — E10649 Type 1 diabetes mellitus with hypoglycemia without coma: Secondary | ICD-10-CM | POA: Diagnosis not present

## 2015-10-22 DIAGNOSIS — Z9641 Presence of insulin pump (external) (internal): Secondary | ICD-10-CM | POA: Diagnosis not present

## 2015-10-22 DIAGNOSIS — E7800 Pure hypercholesterolemia, unspecified: Secondary | ICD-10-CM | POA: Diagnosis not present

## 2015-12-20 DIAGNOSIS — K080 Exfoliation of teeth due to systemic causes: Secondary | ICD-10-CM | POA: Diagnosis not present

## 2015-12-20 DIAGNOSIS — E10649 Type 1 diabetes mellitus with hypoglycemia without coma: Secondary | ICD-10-CM | POA: Diagnosis not present

## 2015-12-23 DIAGNOSIS — E7800 Pure hypercholesterolemia, unspecified: Secondary | ICD-10-CM | POA: Diagnosis not present

## 2015-12-23 DIAGNOSIS — R740 Nonspecific elevation of levels of transaminase and lactic acid dehydrogenase [LDH]: Secondary | ICD-10-CM | POA: Diagnosis not present

## 2015-12-23 DIAGNOSIS — Z9641 Presence of insulin pump (external) (internal): Secondary | ICD-10-CM | POA: Diagnosis not present

## 2015-12-23 DIAGNOSIS — Z23 Encounter for immunization: Secondary | ICD-10-CM | POA: Diagnosis not present

## 2015-12-23 DIAGNOSIS — E10649 Type 1 diabetes mellitus with hypoglycemia without coma: Secondary | ICD-10-CM | POA: Diagnosis not present

## 2016-03-08 DIAGNOSIS — K769 Liver disease, unspecified: Secondary | ICD-10-CM | POA: Diagnosis not present

## 2016-03-08 DIAGNOSIS — E119 Type 2 diabetes mellitus without complications: Secondary | ICD-10-CM | POA: Diagnosis not present

## 2016-03-08 DIAGNOSIS — I10 Essential (primary) hypertension: Secondary | ICD-10-CM | POA: Diagnosis not present

## 2016-04-24 DIAGNOSIS — E10649 Type 1 diabetes mellitus with hypoglycemia without coma: Secondary | ICD-10-CM | POA: Diagnosis not present

## 2016-05-01 DIAGNOSIS — E7800 Pure hypercholesterolemia, unspecified: Secondary | ICD-10-CM | POA: Diagnosis not present

## 2016-05-01 DIAGNOSIS — Z9641 Presence of insulin pump (external) (internal): Secondary | ICD-10-CM | POA: Diagnosis not present

## 2016-05-01 DIAGNOSIS — R740 Nonspecific elevation of levels of transaminase and lactic acid dehydrogenase [LDH]: Secondary | ICD-10-CM | POA: Diagnosis not present

## 2016-05-01 DIAGNOSIS — E109 Type 1 diabetes mellitus without complications: Secondary | ICD-10-CM | POA: Diagnosis not present

## 2016-05-14 DIAGNOSIS — S0511XA Contusion of eyeball and orbital tissues, right eye, initial encounter: Secondary | ICD-10-CM | POA: Diagnosis not present

## 2016-05-14 DIAGNOSIS — Z794 Long term (current) use of insulin: Secondary | ICD-10-CM | POA: Diagnosis not present

## 2016-05-14 DIAGNOSIS — W2107XA Struck by softball, initial encounter: Secondary | ICD-10-CM | POA: Diagnosis not present

## 2016-05-14 DIAGNOSIS — E119 Type 2 diabetes mellitus without complications: Secondary | ICD-10-CM | POA: Diagnosis not present

## 2016-05-14 DIAGNOSIS — S0181XA Laceration without foreign body of other part of head, initial encounter: Secondary | ICD-10-CM | POA: Diagnosis not present

## 2016-05-14 DIAGNOSIS — Z885 Allergy status to narcotic agent status: Secondary | ICD-10-CM | POA: Diagnosis not present

## 2016-05-14 DIAGNOSIS — S0990XA Unspecified injury of head, initial encounter: Secondary | ICD-10-CM | POA: Diagnosis not present

## 2016-07-14 IMAGING — US US BIOPSY
1 series · 9 of 9 positions shown · non-contrast
Comparison: none

CLINICAL DATA: 44-year-old male with a history of transaminitis.

[Series 1: us biopsy · 0.20mm/px · 9 of 9 slices shown]
[im 1/9]
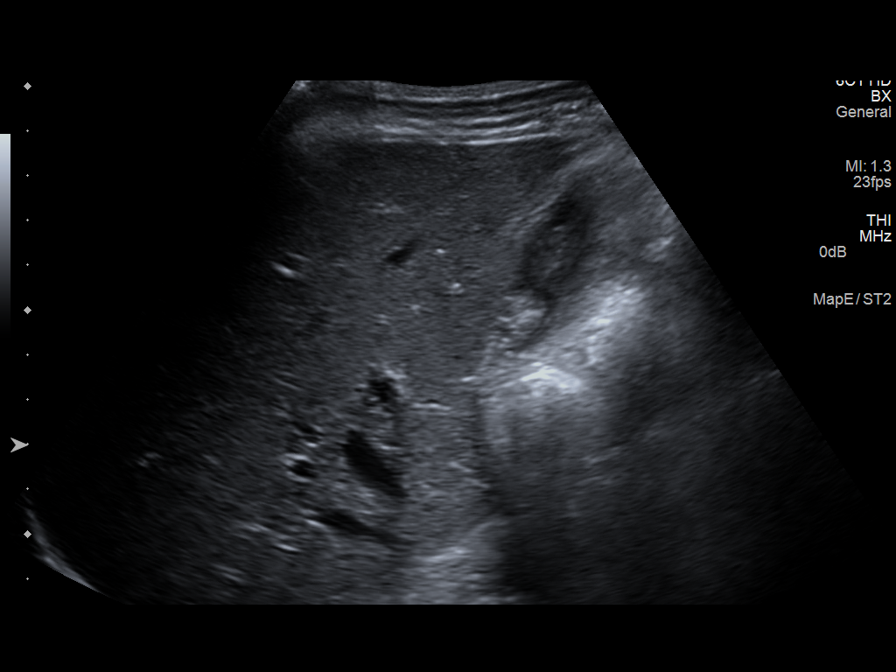
[im 2/9]
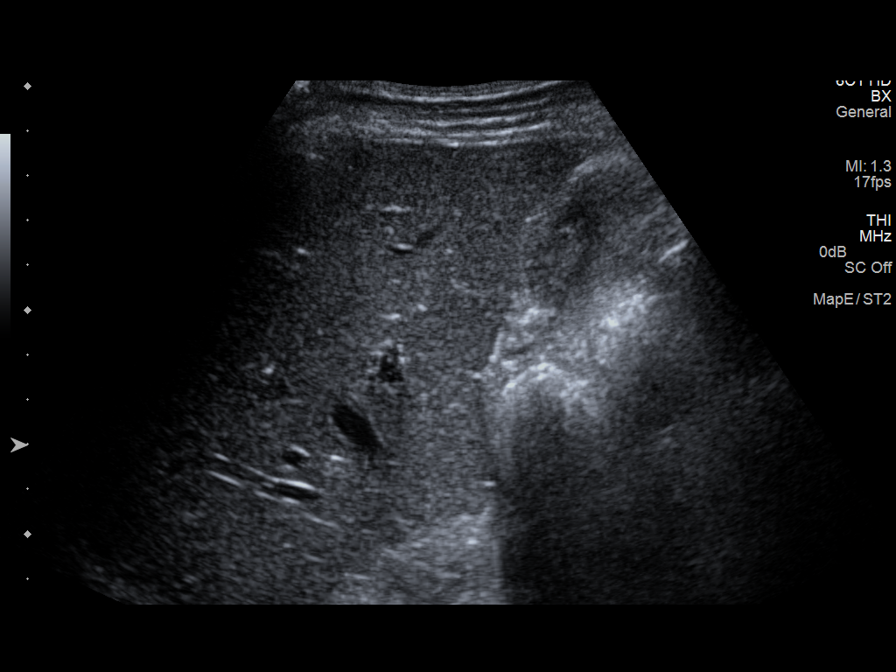
[im 3/9]
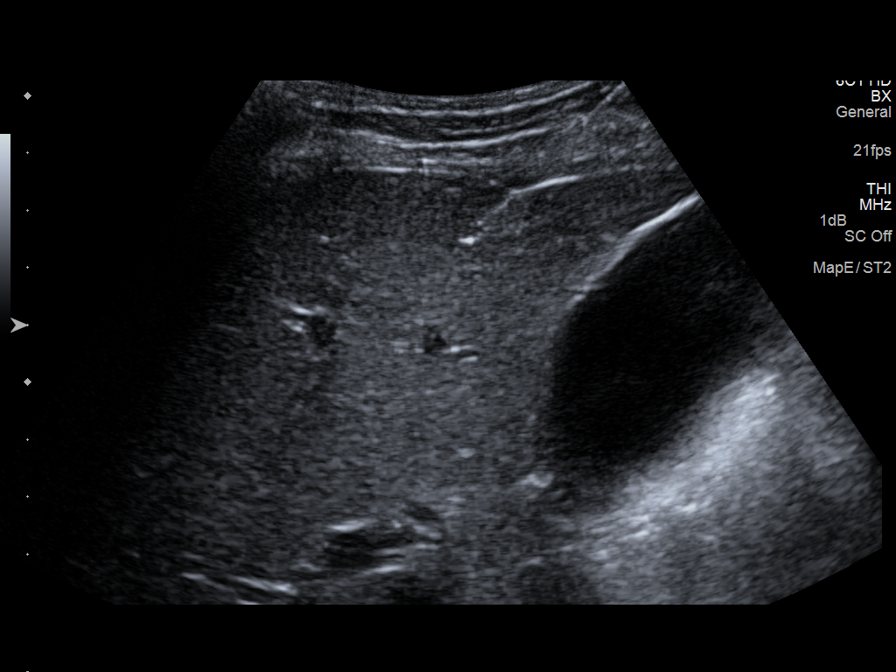
[im 4/9]
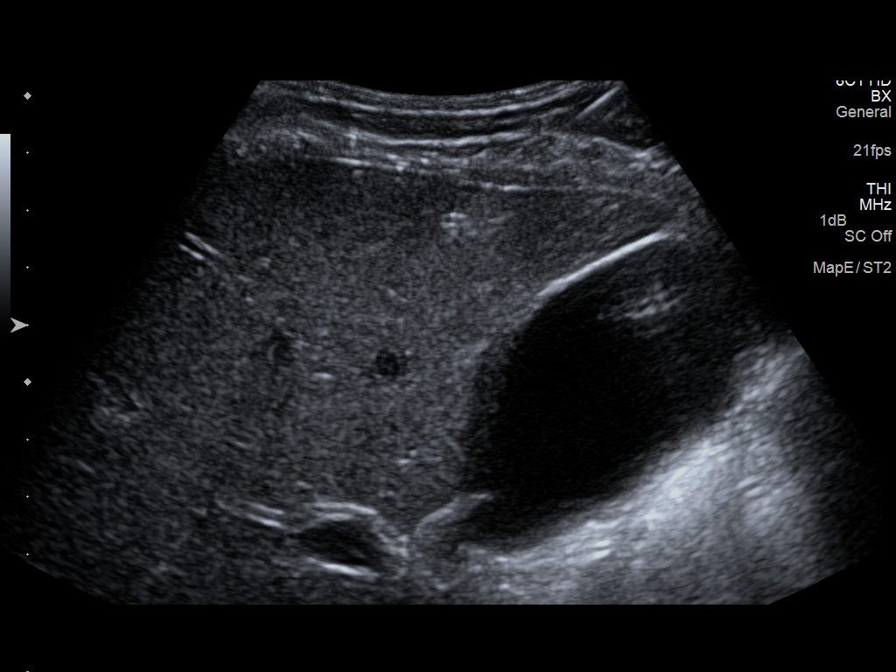
[im 5/9]
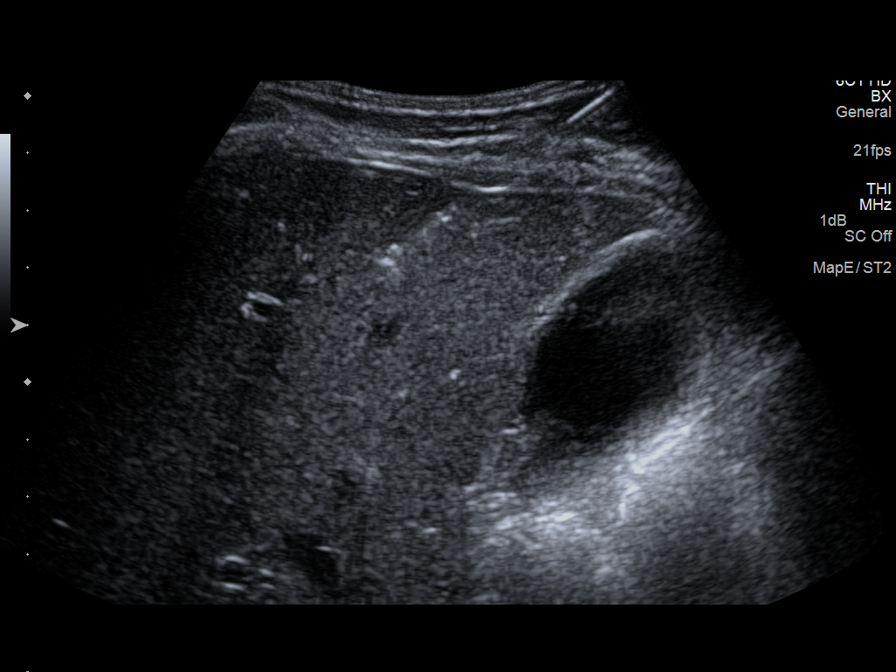
[im 6/9]
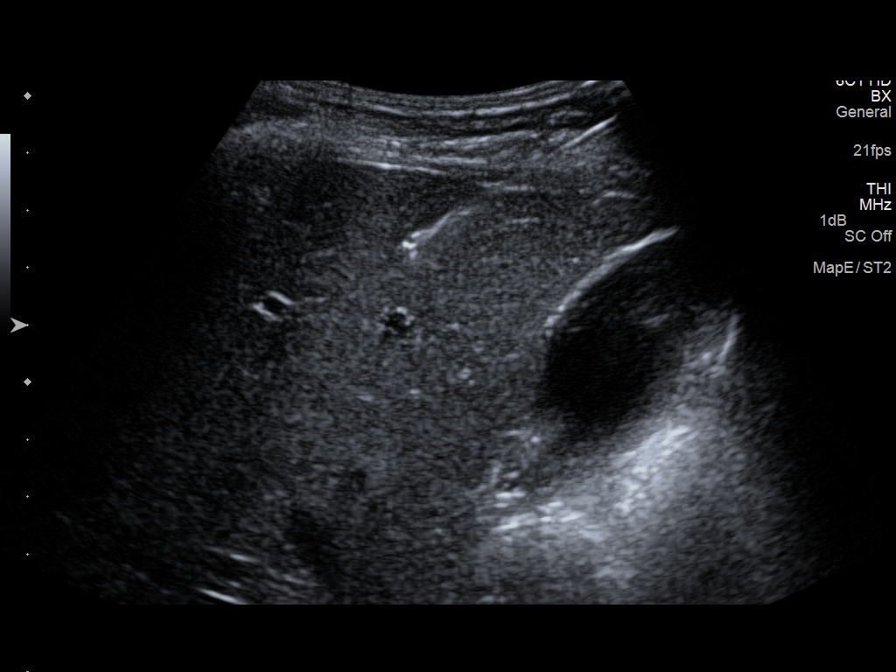
[im 7/9]
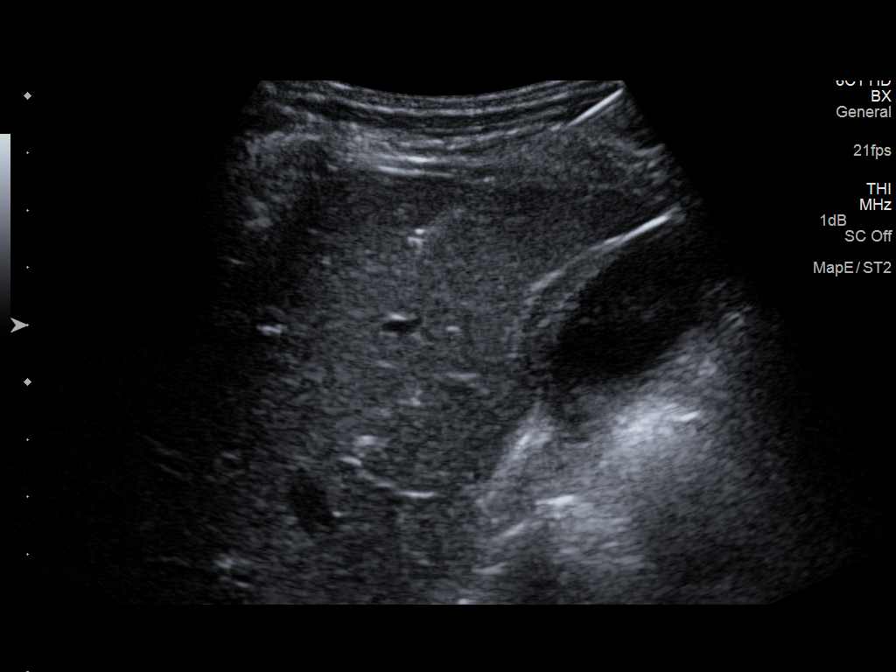
[im 8/9]
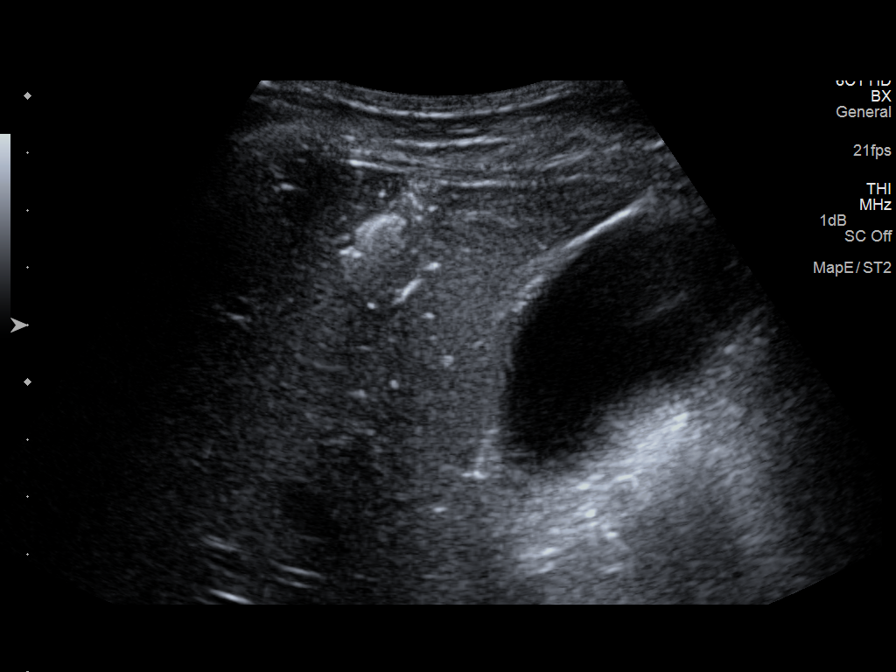
[im 9/9]
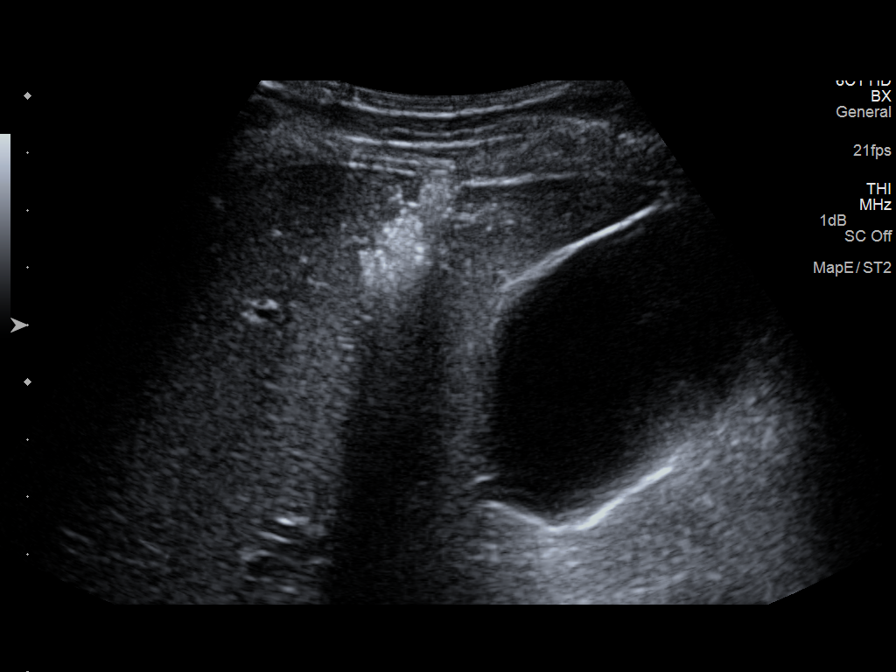

[9 of 9 positions shown; findings below may reference images not displayed]

He has been referred for ultrasound-guided biopsy.

EXAM:
ULTRASOUND GUIDED CORE BIOPSY OF LIVER FOR A MEDICAL LIVER BIOPSY

MEDICATIONS:
1.5 mg IV Versed; 75 mcg IV Fentanyl

Total Moderate Sedation Time: 12 minutes

PROCEDURE:
The procedure, risks, benefits, and alternatives were explained to
the patient. Questions regarding the procedure were encouraged and
answered. The patient understands and consents to the procedure.

Patient is position in the supine position on the gantry table
ultrasound survey of the right upper quadrant was performed. Images
were stored and sent to PACs.

The right upper quadrant was prepped with Betadine in a sterile
fashion, and a sterile drape was applied covering the operative
field. A sterile gown and sterile gloves were used for the
procedure. Local anesthesia was provided with 1% Lidocaine.

Once the patient is prepped and draped sterilely, the skin and
subcutaneous tissues were generously infiltrated 1% lidocaine for
local anesthesia. A small stab incision was made with 11 blade
scalpel common using ultrasound guidance, a 17 gauge trocar needle
was advanced into the right liver lobe. The stylet was removed and 3
separate 18 gauge core biopsy were retrieved.

The samples were placed into formalin for transportation to the lab.

Two Gel-Foam pledgets were infused with a small amount of saline.
The needle was removed.

Final image was stored.

Patient tolerated the procedure well and remained hemodynamically
stable throughout.

No complications were encountered and no significant blood loss was
encountered.

COMPLICATIONS:
None.
FINDINGS: Ultrasound survey demonstrates safe approach to the right liver via
intercostal approach.

Final images demonstrate no complicating features.
IMPRESSION: Status post ultrasound-guided medical liver biopsy. Tissue specimen
sent to pathology for complete histopathologic analysis.

## 2016-08-07 ENCOUNTER — Encounter (INDEPENDENT_AMBULATORY_CARE_PROVIDER_SITE_OTHER): Payer: Self-pay | Admitting: Internal Medicine

## 2016-08-14 DIAGNOSIS — K080 Exfoliation of teeth due to systemic causes: Secondary | ICD-10-CM | POA: Diagnosis not present

## 2016-08-14 DIAGNOSIS — E109 Type 1 diabetes mellitus without complications: Secondary | ICD-10-CM | POA: Diagnosis not present

## 2016-08-21 DIAGNOSIS — E7800 Pure hypercholesterolemia, unspecified: Secondary | ICD-10-CM | POA: Diagnosis not present

## 2016-08-21 DIAGNOSIS — R740 Nonspecific elevation of levels of transaminase and lactic acid dehydrogenase [LDH]: Secondary | ICD-10-CM | POA: Diagnosis not present

## 2016-08-21 DIAGNOSIS — E109 Type 1 diabetes mellitus without complications: Secondary | ICD-10-CM | POA: Diagnosis not present

## 2016-08-21 DIAGNOSIS — Z9641 Presence of insulin pump (external) (internal): Secondary | ICD-10-CM | POA: Diagnosis not present

## 2016-08-23 ENCOUNTER — Encounter (INDEPENDENT_AMBULATORY_CARE_PROVIDER_SITE_OTHER): Payer: Self-pay | Admitting: Internal Medicine

## 2016-08-23 ENCOUNTER — Ambulatory Visit (INDEPENDENT_AMBULATORY_CARE_PROVIDER_SITE_OTHER): Payer: Federal, State, Local not specified - PPO | Admitting: Internal Medicine

## 2016-08-23 VITALS — BP 90/62 | HR 72 | Temp 97.6°F | Ht 71.7 in | Wt 162.3 lb

## 2016-08-23 DIAGNOSIS — R748 Abnormal levels of other serum enzymes: Secondary | ICD-10-CM

## 2016-08-23 NOTE — Progress Notes (Signed)
   Subjective:    Patient ID: Anthony Ramsey, male    DOB: November 12, 1970, 46 y.o.   MRN: [id number]  HPI  Here today for f/u. Last seen in June of 2017. Hx of elevated transaminases.  US unremarkable. Hep B surface antigen non reactive, Hep C antibody negative. Hep B core antibody IGM negative. ANA negative.  Serum ferritin was 47.  Liver biopsy 8/16.2016 was unremarkable except rare eosinophils and portal areas with mild inflammation suggesting drug-induced injury. He is an inhibitor was stopped without normalization of transaminases He is trying to exercise.  His appetite is good. No weight loss. Usually has as a BM daily. No melena or BRRB HA1C 6.6 08/14/2016  Hepatic Function Latest Ref Rng & Units 08/14/2015 05/01/2015 02/13/2015  Total Protein 6.1 - 8.1 g/dL 6.5 6.5 6.0(L)  Albumin 3.6 - 5.1 g/dL 4.3 4.3 3.9  AST 10 - 40 U/L 38 42(H) 32  ALT 9 - 46 U/L 63(H) 89(H) 74(H)  Alk Phosphatase 40 - 115 U/L 148(H) 158(H) 130(H)  Total Bilirubin 0.2 - 1.2 mg/dL 0.8 0.7 0.9  Bilirubin, Direct <=0.2 mg/dL 0.2 0.2 0.2   08/14/2016 AP 121, AST 46, ALT 70 04/06/2016 ALP 122, AST 31, ALT 54.  Review of Systems     Objective:   Physical Exam Blood pressure 90/62, pulse 72, temperature 97.6 F (36.4 C), height 5' 11.7" (1.821 m), weight 162 lb 4.8 oz (73.6 kg).  Alert and oriented. Skin warm and dry. Oral mucosa is moist.   . Sclera anicteric, conjunctivae is pink. Thyroid not enlarged. No cervical lymphadenopathy. Lungs clear. Heart regular rate and rhythm.  Abdomen is soft. Bowel sounds are positive. No hepatomegaly. No abdominal masses felt. No tenderness.  No edema to lower extremities.          Assessment & Plan:  Elevated liver enzymes with negative work. Enzymes  Are slightly elevated.  OV in 1 year.

## 2016-08-23 NOTE — Patient Instructions (Signed)
OV in 1 year.  

## 2016-09-11 ENCOUNTER — Encounter (INDEPENDENT_AMBULATORY_CARE_PROVIDER_SITE_OTHER): Payer: Self-pay

## 2016-11-17 DIAGNOSIS — E7800 Pure hypercholesterolemia, unspecified: Secondary | ICD-10-CM | POA: Diagnosis not present

## 2016-11-17 DIAGNOSIS — E109 Type 1 diabetes mellitus without complications: Secondary | ICD-10-CM | POA: Diagnosis not present

## 2016-11-24 DIAGNOSIS — E109 Type 1 diabetes mellitus without complications: Secondary | ICD-10-CM | POA: Diagnosis not present

## 2016-11-24 DIAGNOSIS — Z23 Encounter for immunization: Secondary | ICD-10-CM | POA: Diagnosis not present

## 2016-11-24 DIAGNOSIS — Z9641 Presence of insulin pump (external) (internal): Secondary | ICD-10-CM | POA: Diagnosis not present

## 2016-11-24 DIAGNOSIS — E7800 Pure hypercholesterolemia, unspecified: Secondary | ICD-10-CM | POA: Diagnosis not present

## 2016-11-24 DIAGNOSIS — R740 Nonspecific elevation of levels of transaminase and lactic acid dehydrogenase [LDH]: Secondary | ICD-10-CM | POA: Diagnosis not present

## 2017-03-12 ENCOUNTER — Encounter: Payer: Self-pay | Admitting: Internal Medicine

## 2017-03-12 DIAGNOSIS — E7800 Pure hypercholesterolemia, unspecified: Secondary | ICD-10-CM | POA: Diagnosis not present

## 2017-03-12 DIAGNOSIS — E109 Type 1 diabetes mellitus without complications: Secondary | ICD-10-CM | POA: Diagnosis not present

## 2017-03-19 DIAGNOSIS — R740 Nonspecific elevation of levels of transaminase and lactic acid dehydrogenase [LDH]: Secondary | ICD-10-CM | POA: Diagnosis not present

## 2017-03-19 DIAGNOSIS — Z9641 Presence of insulin pump (external) (internal): Secondary | ICD-10-CM | POA: Diagnosis not present

## 2017-03-19 DIAGNOSIS — E109 Type 1 diabetes mellitus without complications: Secondary | ICD-10-CM | POA: Diagnosis not present

## 2017-03-19 DIAGNOSIS — E7800 Pure hypercholesterolemia, unspecified: Secondary | ICD-10-CM | POA: Diagnosis not present

## 2017-03-26 DIAGNOSIS — K080 Exfoliation of teeth due to systemic causes: Secondary | ICD-10-CM | POA: Diagnosis not present

## 2017-06-14 DIAGNOSIS — E109 Type 1 diabetes mellitus without complications: Secondary | ICD-10-CM | POA: Diagnosis not present

## 2017-06-14 DIAGNOSIS — E7800 Pure hypercholesterolemia, unspecified: Secondary | ICD-10-CM | POA: Diagnosis not present

## 2017-06-18 DIAGNOSIS — R740 Nonspecific elevation of levels of transaminase and lactic acid dehydrogenase [LDH]: Secondary | ICD-10-CM | POA: Diagnosis not present

## 2017-06-18 DIAGNOSIS — E7800 Pure hypercholesterolemia, unspecified: Secondary | ICD-10-CM | POA: Diagnosis not present

## 2017-06-18 DIAGNOSIS — E109 Type 1 diabetes mellitus without complications: Secondary | ICD-10-CM | POA: Diagnosis not present

## 2017-06-18 DIAGNOSIS — Z9641 Presence of insulin pump (external) (internal): Secondary | ICD-10-CM | POA: Diagnosis not present

## 2017-08-27 ENCOUNTER — Ambulatory Visit (INDEPENDENT_AMBULATORY_CARE_PROVIDER_SITE_OTHER): Payer: Federal, State, Local not specified - PPO | Admitting: Internal Medicine

## 2017-10-05 DIAGNOSIS — E109 Type 1 diabetes mellitus without complications: Secondary | ICD-10-CM | POA: Diagnosis not present

## 2017-10-05 DIAGNOSIS — E7800 Pure hypercholesterolemia, unspecified: Secondary | ICD-10-CM | POA: Diagnosis not present

## 2017-10-12 DIAGNOSIS — Z9641 Presence of insulin pump (external) (internal): Secondary | ICD-10-CM | POA: Diagnosis not present

## 2017-10-12 DIAGNOSIS — E109 Type 1 diabetes mellitus without complications: Secondary | ICD-10-CM | POA: Diagnosis not present

## 2017-10-12 DIAGNOSIS — E7800 Pure hypercholesterolemia, unspecified: Secondary | ICD-10-CM | POA: Diagnosis not present

## 2017-10-12 DIAGNOSIS — R740 Nonspecific elevation of levels of transaminase and lactic acid dehydrogenase [LDH]: Secondary | ICD-10-CM | POA: Diagnosis not present

## 2017-10-22 DIAGNOSIS — K080 Exfoliation of teeth due to systemic causes: Secondary | ICD-10-CM | POA: Diagnosis not present

## 2018-01-15 DIAGNOSIS — E109 Type 1 diabetes mellitus without complications: Secondary | ICD-10-CM | POA: Diagnosis not present

## 2018-01-15 DIAGNOSIS — E7800 Pure hypercholesterolemia, unspecified: Secondary | ICD-10-CM | POA: Diagnosis not present

## 2018-01-18 DIAGNOSIS — E7800 Pure hypercholesterolemia, unspecified: Secondary | ICD-10-CM | POA: Diagnosis not present

## 2018-01-18 DIAGNOSIS — R740 Nonspecific elevation of levels of transaminase and lactic acid dehydrogenase [LDH]: Secondary | ICD-10-CM | POA: Diagnosis not present

## 2018-01-18 DIAGNOSIS — E109 Type 1 diabetes mellitus without complications: Secondary | ICD-10-CM | POA: Diagnosis not present

## 2018-01-18 DIAGNOSIS — Z23 Encounter for immunization: Secondary | ICD-10-CM | POA: Diagnosis not present

## 2018-01-18 DIAGNOSIS — Z9641 Presence of insulin pump (external) (internal): Secondary | ICD-10-CM | POA: Diagnosis not present

## 2018-04-08 DIAGNOSIS — E7800 Pure hypercholesterolemia, unspecified: Secondary | ICD-10-CM | POA: Diagnosis not present

## 2018-04-08 DIAGNOSIS — E109 Type 1 diabetes mellitus without complications: Secondary | ICD-10-CM | POA: Diagnosis not present

## 2018-04-15 DIAGNOSIS — K080 Exfoliation of teeth due to systemic causes: Secondary | ICD-10-CM | POA: Diagnosis not present

## 2018-04-15 DIAGNOSIS — Z9641 Presence of insulin pump (external) (internal): Secondary | ICD-10-CM | POA: Diagnosis not present

## 2018-04-15 DIAGNOSIS — E7800 Pure hypercholesterolemia, unspecified: Secondary | ICD-10-CM | POA: Diagnosis not present

## 2018-04-15 DIAGNOSIS — E109 Type 1 diabetes mellitus without complications: Secondary | ICD-10-CM | POA: Diagnosis not present

## 2018-04-15 DIAGNOSIS — R740 Nonspecific elevation of levels of transaminase and lactic acid dehydrogenase [LDH]: Secondary | ICD-10-CM | POA: Diagnosis not present

## 2018-06-25 ENCOUNTER — Encounter (INDEPENDENT_AMBULATORY_CARE_PROVIDER_SITE_OTHER): Payer: Self-pay | Admitting: Internal Medicine

## 2018-06-25 ENCOUNTER — Other Ambulatory Visit: Payer: Self-pay

## 2018-06-25 ENCOUNTER — Other Ambulatory Visit (INDEPENDENT_AMBULATORY_CARE_PROVIDER_SITE_OTHER): Payer: Self-pay | Admitting: *Deleted

## 2018-06-25 ENCOUNTER — Ambulatory Visit (INDEPENDENT_AMBULATORY_CARE_PROVIDER_SITE_OTHER): Payer: Federal, State, Local not specified - PPO | Admitting: Internal Medicine

## 2018-06-25 VITALS — BP 114/71 | HR 68 | Temp 98.4°F | Resp 18 | Ht 71.0 in | Wt 164.8 lb

## 2018-06-25 DIAGNOSIS — E785 Hyperlipidemia, unspecified: Secondary | ICD-10-CM

## 2018-06-25 DIAGNOSIS — R748 Abnormal levels of other serum enzymes: Secondary | ICD-10-CM

## 2018-06-25 MED ORDER — SIMVASTATIN 10 MG PO TABS: 10.0000 mg | ORAL_TABLET | Freq: Every day | ORAL | 3 refills | Status: AC

## 2018-06-25 NOTE — Progress Notes (Signed)
Presenting complaint;  History of elevated transaminases. Patient interested in resuming statin.  Database and subjective:  Patient is a 48 year old Caucasian male with type 1 diabetes mellitus who was initially seen in our office September 2015 for mildly elevated AST and ALT.  He had following work-up by Dr. Juanetta Gosling. Hepatitis B surface antigen was negative Hepatitis A antibody IgM was negative Hepatitis B core antibody IgM was negative Hepatitis C virus antibody was negative.  Ultrasound on 09/22/2013 revealed no abnormality. Patient had been on simvastatin which was discontinued by Dr. Juanetta Gosling but his transaminases did not return to normal.  Further work-up included normal ceruloplasmin, normal alpha-1 antitrypsin sed rate of 1, smooth muscle antibody was mildly elevated but ANA was negative.  Antimitochondrial antibody was negative and ferritin was 47. He therefore underwent liver biopsy in August 2016 which revealed no evidence of steatosis.  He had few eosinophils in portal areas with minimal inflammation.  He did not have fibrosis.  I felt these changes are indicative of drug-induced hepatic injury.  ACE inhibitor was discontinued when once again without normalization of transaminases.  We therefore decided to monitor his clinical course. He was last seen in our office in June 2018.  He sees Dr. Leslie Dales regarding his diabetes mellitus.  He has been having blood work periodically by him.  Dr. Leslie Dales has recommended he should go back on medication to improve his lipid profile as risk of CAD significant given history of IDDM. Patient does not recall having any side effects with simvastatin.  He has good appetite.  He denies abdominal pain pruritus diarrhea constipation melena or rectal bleeding.  He is very active.  He is on soccer field with his daughter multiple times in a week.  He does not drink alcohol. He does not take OTC medications. He has gained 6 pounds since he was last  seen. Family history is negative for CRC.   Current Medications: Outpatient Encounter Medications as of 06/25/2018  Medication Sig  . Insulin Human (INSULIN PUMP) SOLN Inject into the skin. Fast Acting insulin. Novalog.   No facility-administered encounter medications on file as of 06/25/2018.     Objective: Blood pressure 114/71, pulse 68, temperature 98.4 F (36.9 C), temperature source Oral, resp. rate 18, height 5\' 11"  (1.803 m), weight 164 lb 12.8 oz (74.8 kg). Patient is alert and in no acute distress. Conjunctiva is pink. Sclera is nonicteric Oropharyngeal mucosa is normal. No neck masses or thyromegaly noted. Cardiac exam with regular rhythm normal S1 and S2. No murmur or gallop noted. Lungs are clear to auscultation. Abdomen abdomen is flat.  He has a glucose recording device in right lower quadrant of abdomen and insulin pump in right flank.  Abdomen is soft and nontender.  Liver edge is indistinct below RCM.  Span is felt to be 14 cm. No LE edema or clubbing noted.  Labs/studies Results: Lab data from 04/08/2018 Bilirubin 0.7, AP 119, AST 30, ALT 54, total protein 6.2 and albumin 4.4. BUN 18 and creatinine 1.07. Total cholesterol 178, LDL 123, HDL 46, triglycerides 56.   AST and ALT were 42 and 67 respectively on 01/15/2018.  AST and ALT were 39 and 70 respectively on 10/05/2017  AST and ALT were 44 and 58 respectively on 09/10/2013.  Assessment:  Mildly elevated transaminases of about 5 years duration in a patient with insulin-dependent diabetes mellitus.  Transaminases did not return to normal on stopping simvastatin. Biochemical markers have all been negative except for mildly elevated smooth muscle  antibody.  Liver biopsy in August 2016 revealed scant eosinophils and portal area suggesting drug-induced injury.  ACE inhibitor was stopped but without normalization of transaminases. Given extensive work-up one has to presume that he has nonspecific elevation of  transaminases.  He does not have stigmata of chronic liver disease. Given significant risk of atherosclerosis in the setting of insulin-dependent diabetes mellitus it would be reasonable for him to go back on statin drug.  We will start him on simvastatin 10 mg daily and escalate dose to 20 mg daily unless there is significant bump in transaminases with lower dose.  Dose change will be made after 3 months.   Plan:  Begin simvastatin 10 mg by mouth daily. LFTs and CPK in 4 weeks and 12 weeks. He will have lipid profile at week 12. Office visit in 1 year.

## 2018-06-25 NOTE — Patient Instructions (Signed)
LFTs and CK to be checked in 4 weeks.

## 2018-07-08 DIAGNOSIS — E109 Type 1 diabetes mellitus without complications: Secondary | ICD-10-CM | POA: Diagnosis not present

## 2018-07-08 DIAGNOSIS — E7800 Pure hypercholesterolemia, unspecified: Secondary | ICD-10-CM | POA: Diagnosis not present

## 2018-07-15 DIAGNOSIS — E7800 Pure hypercholesterolemia, unspecified: Secondary | ICD-10-CM | POA: Diagnosis not present

## 2018-07-15 DIAGNOSIS — R740 Nonspecific elevation of levels of transaminase and lactic acid dehydrogenase [LDH]: Secondary | ICD-10-CM | POA: Diagnosis not present

## 2018-07-15 DIAGNOSIS — Z9641 Presence of insulin pump (external) (internal): Secondary | ICD-10-CM | POA: Diagnosis not present

## 2018-07-15 DIAGNOSIS — E109 Type 1 diabetes mellitus without complications: Secondary | ICD-10-CM | POA: Diagnosis not present

## 2018-09-05 DIAGNOSIS — R748 Abnormal levels of other serum enzymes: Secondary | ICD-10-CM | POA: Diagnosis not present

## 2018-09-05 DIAGNOSIS — E785 Hyperlipidemia, unspecified: Secondary | ICD-10-CM | POA: Diagnosis not present

## 2018-09-06 LAB — HEPATIC FUNCTION PANEL
AG Ratio: 2.2 (calc) (ref 1.0–2.5)
ALT: 57 U/L — ABNORMAL HIGH (ref 9–46)
AST: 25 U/L (ref 10–40)
Albumin: 4.4 g/dL (ref 3.6–5.1)
Alkaline phosphatase (APISO): 135 U/L — ABNORMAL HIGH (ref 36–130)
Bilirubin, Direct: 0.2 mg/dL (ref 0.0–0.2)
Globulin: 2 g/dL (calc) (ref 1.9–3.7)
Indirect Bilirubin: 0.5 mg/dL (calc) (ref 0.2–1.2)
Total Bilirubin: 0.7 mg/dL (ref 0.2–1.2)
Total Protein: 6.4 g/dL (ref 6.1–8.1)

## 2018-09-06 LAB — CK: Total CK: 91 U/L (ref 44–196)

## 2018-09-09 ENCOUNTER — Other Ambulatory Visit (INDEPENDENT_AMBULATORY_CARE_PROVIDER_SITE_OTHER): Payer: Self-pay | Admitting: *Deleted

## 2018-09-09 DIAGNOSIS — E785 Hyperlipidemia, unspecified: Secondary | ICD-10-CM

## 2018-10-14 DIAGNOSIS — E7800 Pure hypercholesterolemia, unspecified: Secondary | ICD-10-CM | POA: Diagnosis not present

## 2018-10-14 DIAGNOSIS — E109 Type 1 diabetes mellitus without complications: Secondary | ICD-10-CM | POA: Diagnosis not present

## 2018-10-21 DIAGNOSIS — Z9641 Presence of insulin pump (external) (internal): Secondary | ICD-10-CM | POA: Diagnosis not present

## 2018-10-21 DIAGNOSIS — E7800 Pure hypercholesterolemia, unspecified: Secondary | ICD-10-CM | POA: Diagnosis not present

## 2018-10-21 DIAGNOSIS — E109 Type 1 diabetes mellitus without complications: Secondary | ICD-10-CM | POA: Diagnosis not present

## 2018-10-21 DIAGNOSIS — R740 Nonspecific elevation of levels of transaminase and lactic acid dehydrogenase [LDH]: Secondary | ICD-10-CM | POA: Diagnosis not present

## 2018-11-29 ENCOUNTER — Other Ambulatory Visit (INDEPENDENT_AMBULATORY_CARE_PROVIDER_SITE_OTHER): Payer: Self-pay | Admitting: *Deleted

## 2018-11-29 DIAGNOSIS — E785 Hyperlipidemia, unspecified: Secondary | ICD-10-CM

## 2019-01-24 DIAGNOSIS — E7800 Pure hypercholesterolemia, unspecified: Secondary | ICD-10-CM | POA: Diagnosis not present

## 2019-01-24 DIAGNOSIS — E109 Type 1 diabetes mellitus without complications: Secondary | ICD-10-CM | POA: Diagnosis not present

## 2019-06-24 ENCOUNTER — Other Ambulatory Visit: Payer: Self-pay

## 2019-06-24 ENCOUNTER — Ambulatory Visit (INDEPENDENT_AMBULATORY_CARE_PROVIDER_SITE_OTHER): Payer: Federal, State, Local not specified - PPO | Admitting: Internal Medicine
